# Patient Record
Sex: Female | Born: 1940 | Race: White | Hispanic: No | Marital: Married
Health system: Southern US, Community
[De-identification: ages and names within clinical notes are randomized; demographics above are authoritative.]

---

## 1999-04-25 ENCOUNTER — Encounter: Payer: Self-pay | Admitting: Family Medicine

## 1999-04-25 ENCOUNTER — Encounter: Admission: RE | Admit: 1999-04-25 | Discharge: 1999-04-25 | Payer: Self-pay | Admitting: Family Medicine

## 1999-05-03 ENCOUNTER — Encounter: Admission: RE | Admit: 1999-05-03 | Discharge: 1999-05-03 | Payer: Self-pay | Admitting: Family Medicine

## 1999-05-03 ENCOUNTER — Encounter: Payer: Self-pay | Admitting: Family Medicine

## 1999-11-01 ENCOUNTER — Encounter: Payer: Self-pay | Admitting: Family Medicine

## 1999-11-01 ENCOUNTER — Encounter: Admission: RE | Admit: 1999-11-01 | Discharge: 1999-11-01 | Payer: Self-pay | Admitting: Family Medicine

## 2000-12-06 ENCOUNTER — Encounter: Admission: RE | Admit: 2000-12-06 | Discharge: 2000-12-06 | Payer: Self-pay | Admitting: Family Medicine

## 2000-12-06 ENCOUNTER — Encounter: Payer: Self-pay | Admitting: Family Medicine

## 2002-01-02 ENCOUNTER — Encounter: Payer: Self-pay | Admitting: Family Medicine

## 2002-01-02 ENCOUNTER — Encounter: Admission: RE | Admit: 2002-01-02 | Discharge: 2002-01-02 | Payer: Self-pay | Admitting: Family Medicine

## 2003-01-21 ENCOUNTER — Encounter: Admission: RE | Admit: 2003-01-21 | Discharge: 2003-01-21 | Payer: Self-pay | Admitting: Gastroenterology

## 2003-11-15 ENCOUNTER — Ambulatory Visit (HOSPITAL_COMMUNITY): Admission: RE | Admit: 2003-11-15 | Discharge: 2003-11-15 | Payer: Self-pay | Admitting: Gastroenterology

## 2004-02-11 ENCOUNTER — Encounter: Admission: RE | Admit: 2004-02-11 | Discharge: 2004-02-11 | Payer: Self-pay | Admitting: Family Medicine

## 2004-09-20 ENCOUNTER — Encounter: Admission: RE | Admit: 2004-09-20 | Discharge: 2004-09-20 | Payer: Self-pay | Admitting: Family Medicine

## 2005-02-09 ENCOUNTER — Encounter: Admission: RE | Admit: 2005-02-09 | Discharge: 2005-02-09 | Payer: Self-pay | Admitting: Family Medicine

## 2005-02-16 ENCOUNTER — Encounter: Admission: RE | Admit: 2005-02-16 | Discharge: 2005-02-16 | Payer: Self-pay | Admitting: Family Medicine

## 2005-02-26 ENCOUNTER — Other Ambulatory Visit: Admission: RE | Admit: 2005-02-26 | Discharge: 2005-02-26 | Payer: Self-pay | Admitting: Interventional Radiology

## 2005-02-26 ENCOUNTER — Encounter: Admission: RE | Admit: 2005-02-26 | Discharge: 2005-02-26 | Payer: Self-pay | Admitting: Family Medicine

## 2005-03-15 ENCOUNTER — Encounter (INDEPENDENT_AMBULATORY_CARE_PROVIDER_SITE_OTHER): Payer: Self-pay | Admitting: Specialist

## 2005-03-15 ENCOUNTER — Encounter: Admission: RE | Admit: 2005-03-15 | Discharge: 2005-03-15 | Payer: Self-pay | Admitting: Family Medicine

## 2005-04-11 ENCOUNTER — Encounter: Admission: RE | Admit: 2005-04-11 | Discharge: 2005-04-11 | Payer: Self-pay | Admitting: Family Medicine

## 2005-05-02 ENCOUNTER — Encounter: Admission: RE | Admit: 2005-05-02 | Discharge: 2005-05-02 | Payer: Self-pay | Admitting: Family Medicine

## 2005-08-14 ENCOUNTER — Encounter: Admission: RE | Admit: 2005-08-14 | Discharge: 2005-08-14 | Payer: Self-pay | Admitting: Endocrinology

## 2006-02-11 ENCOUNTER — Encounter: Admission: RE | Admit: 2006-02-11 | Discharge: 2006-02-11 | Payer: Self-pay | Admitting: Endocrinology

## 2006-05-03 ENCOUNTER — Encounter: Admission: RE | Admit: 2006-05-03 | Discharge: 2006-05-03 | Payer: Self-pay | Admitting: Family Medicine

## 2007-02-10 ENCOUNTER — Encounter: Admission: RE | Admit: 2007-02-10 | Discharge: 2007-02-10 | Payer: Self-pay | Admitting: Endocrinology

## 2007-05-07 ENCOUNTER — Encounter: Admission: RE | Admit: 2007-05-07 | Discharge: 2007-05-07 | Payer: Self-pay | Admitting: Family Medicine

## 2007-09-07 IMAGING — CT CT CHEST W/O CM
1 series · 15 of 32 positions shown, 19 images · IV contrast (agent unspecified)
Comparison: none

CLINICAL DATA: Follow up of left lower lobe pulmonary nodule seen on abdominal CT scan dated 09/20/04.  
CT CHEST W/O CONTRAST:
CHEST CT WITHOUT CONTRAST:
TECHNIQUE: Multidetector CT imaging of the chest was performed following the standard protocol without IV contrast.
Scans were performed without contrast.  This scan demonstrates a slightly lobulated 6mm nodule in the left lower lobe as previously demonstrated appearing essentially unchanged in size and configuration.  There is a 5mm nodule more inferiorly and medially in the left lower lobe and a 4mm nodule more superiorly and medially in the left lower lobe. 
There are two tiny vague areas of density in the right lung without discrete definitive nodules.  There are some small nodes in the precarinal region as well as a possible 15mm node in the aortopulmonary window, although this could be partial volume from the top of the left main pulmonary artery.  
Remainder of the chest is within normal limits. 
The scan also demonstrates a 2cm lesion in the lateral aspect of the right lobe of the liver, unchanged in size and configuration.  There are multiple small punctate calcifications within the lesion, most of them on the periphery.  This is probably an atypical hemangioma.  It has not changed in size and configuration.

[Series 2: — · axial · 0.70mm/px · z∈[-299,+11]mm · 15 of 71 slices shown, 19 images]
[im 6/71  mediastinal]
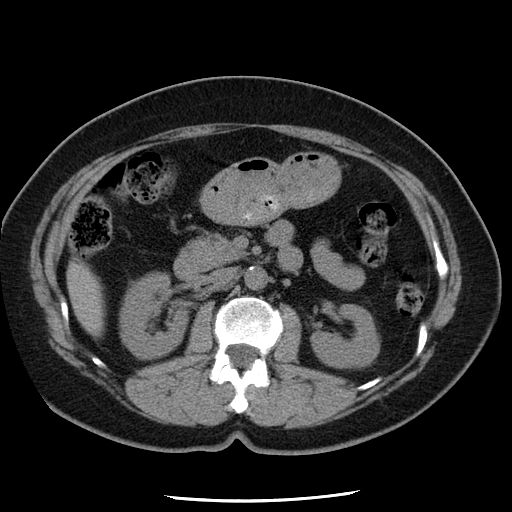
[im 6/71  lung]
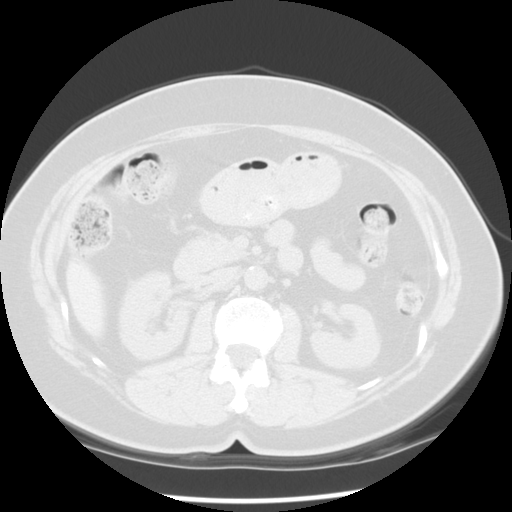
[im 11/71  lung]
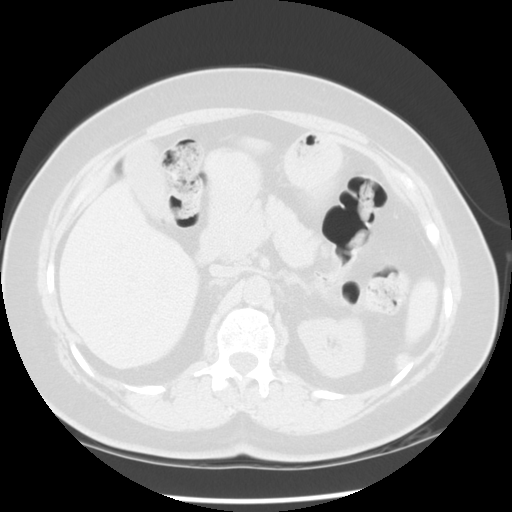
[im 15/71  lung]
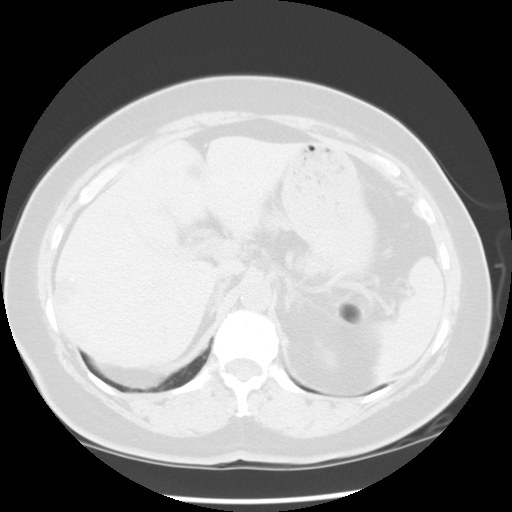
[im 19/71  lung]
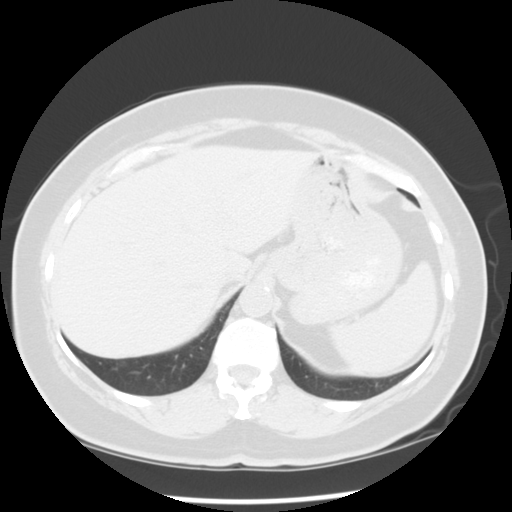
[im 24/71  mediastinal]
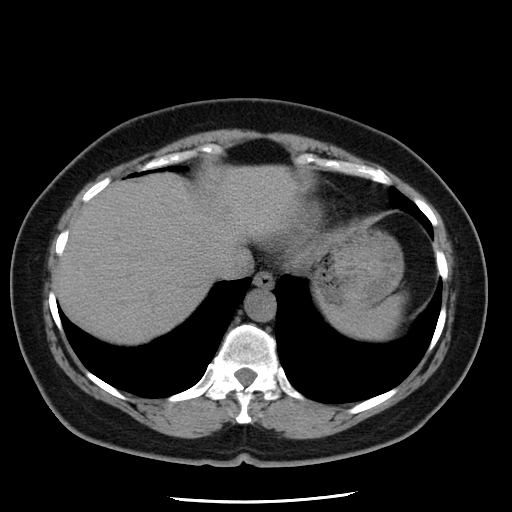
[im 24/71  lung]
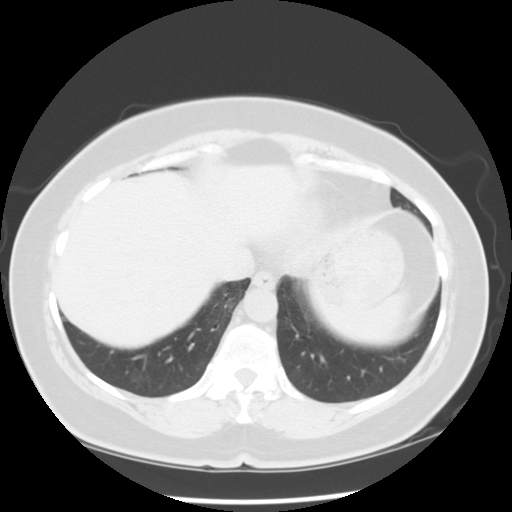
[im 29/71  lung]
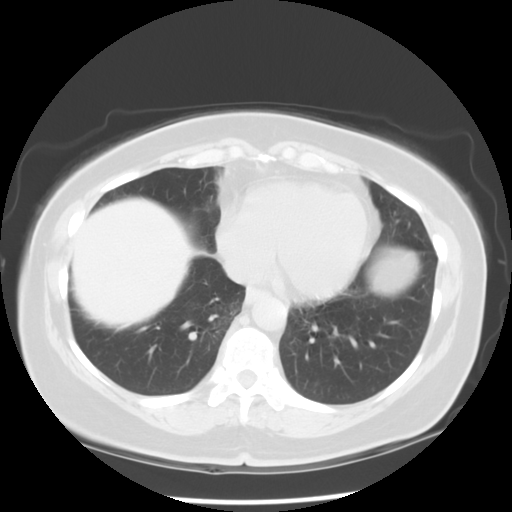
[im 34/71  lung]
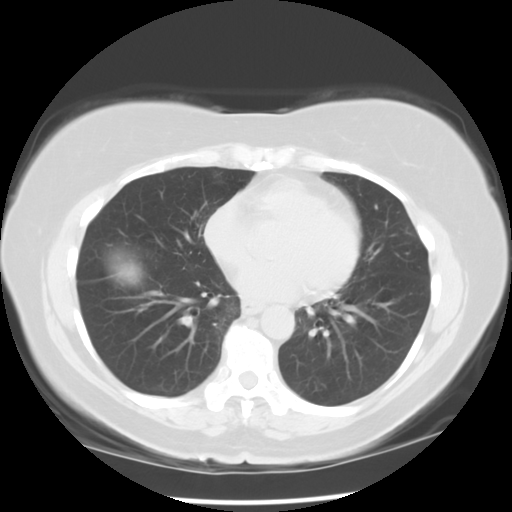
[im 38/71  lung]
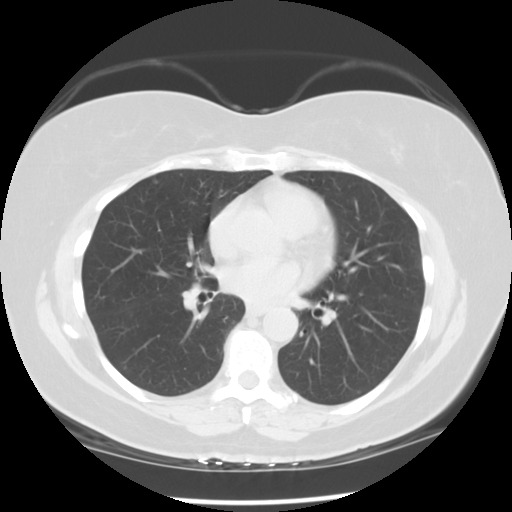
[im 42/71  mediastinal]
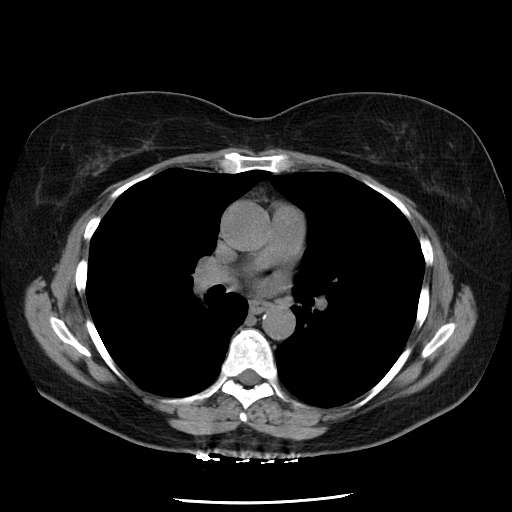
[im 42/71  lung]
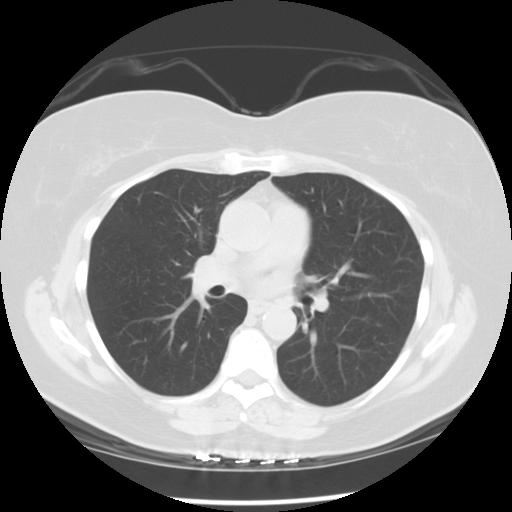
[im 45/71  lung]
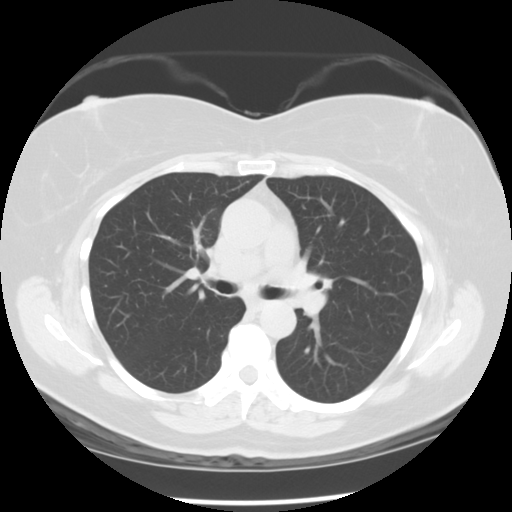
[im 50/71  lung]
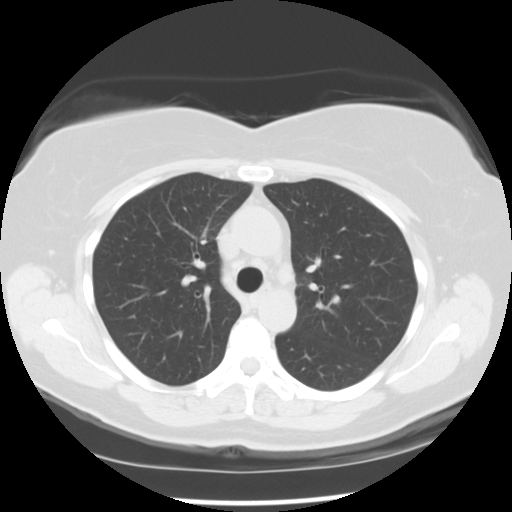
[im 55/71  lung]
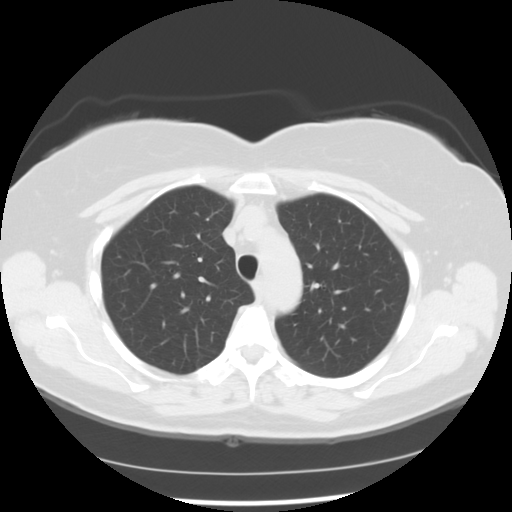
[im 58/71  mediastinal]
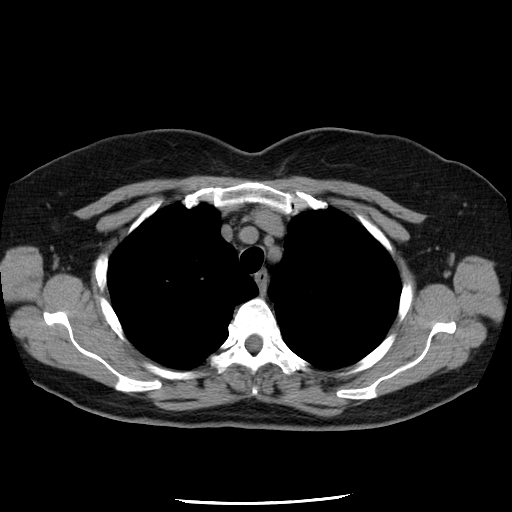
[im 58/71  lung]
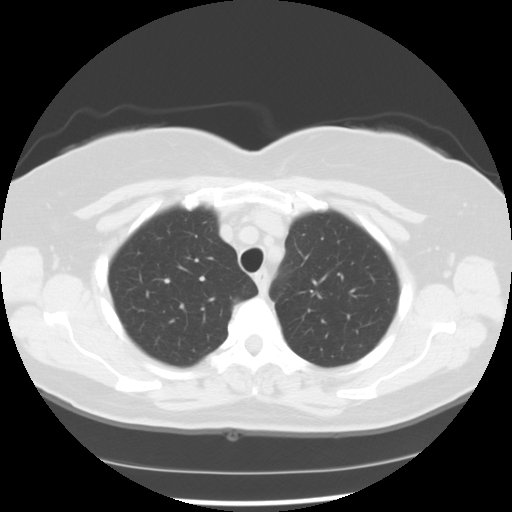
[im 63/71  lung]
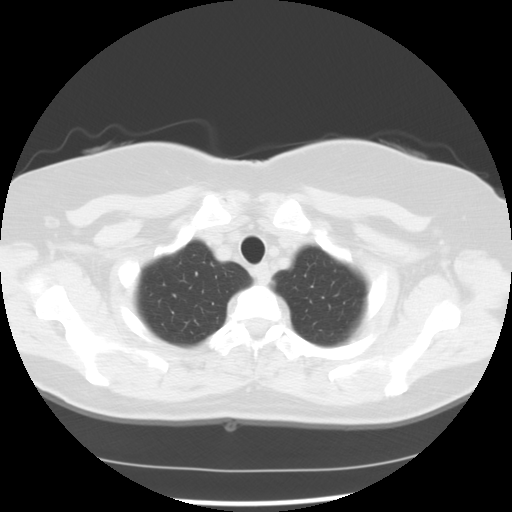
[im 68/71  lung]
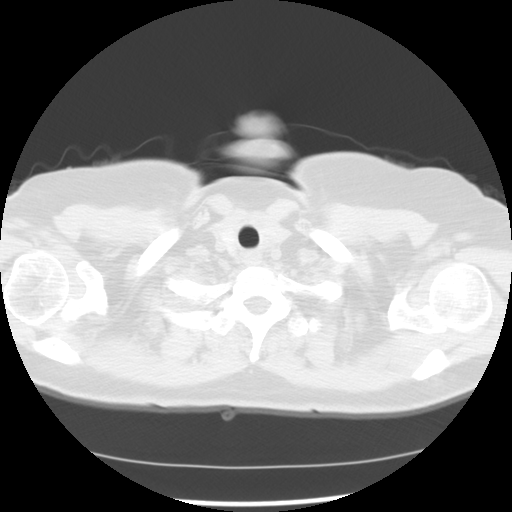

[15 of 32 positions shown; findings below may reference images not displayed]

IMPRESSION: The patient has multiple small nodules in the left lower lobe with the largest measuring 6mm in diameter and slightly lobulated.  These are most likely benign but given the size of the largest lesion, I do recommend an additional follow up in four months without contrast.  At this time the liver lesion could also be evaluated for ongoing stability since the calcifications are atypical for hemangioma.  However, this lesion has remained stable since [DATE].

## 2008-04-14 ENCOUNTER — Encounter: Admission: RE | Admit: 2008-04-14 | Discharge: 2008-04-14 | Payer: Self-pay | Admitting: Family Medicine

## 2008-05-11 ENCOUNTER — Encounter: Admission: RE | Admit: 2008-05-11 | Discharge: 2008-05-11 | Payer: Self-pay | Admitting: Family Medicine

## 2008-07-15 ENCOUNTER — Encounter: Admission: RE | Admit: 2008-07-15 | Discharge: 2008-07-15 | Payer: Self-pay | Admitting: Gastroenterology

## 2009-05-23 ENCOUNTER — Encounter: Admission: RE | Admit: 2009-05-23 | Discharge: 2009-05-23 | Payer: Self-pay | Admitting: Family Medicine

## 2009-09-07 IMAGING — US US SOFT TISSUE HEAD/NECK
1 series · 13 of 25 positions shown · non-contrast
Comparison: 02/11/2006

THYROID ULTRASOUND:

CLINICAL DATA: Thyroid nodule
TECHNIQUE: Ultrasound examination of the thyroid gland and adjacent soft tissue
structures was performed.

[Series 1: us soft tissue head/neck · 0.08mm/px · 13 of 33 slices shown]
[im 1/33]
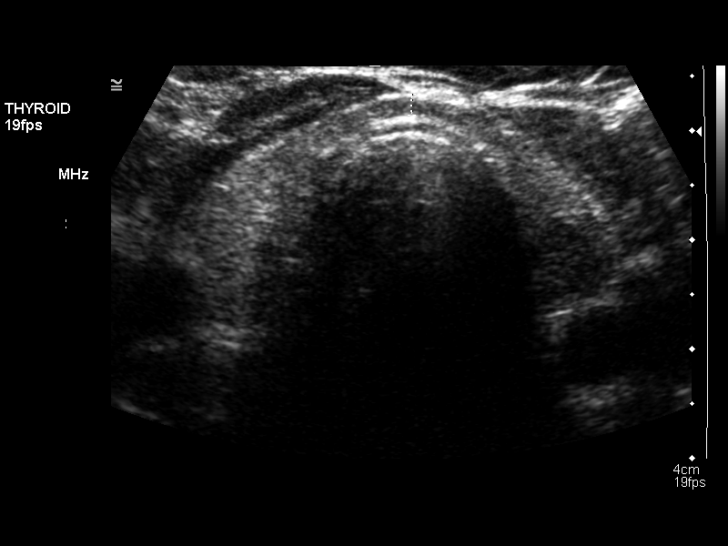
[im 3/33]
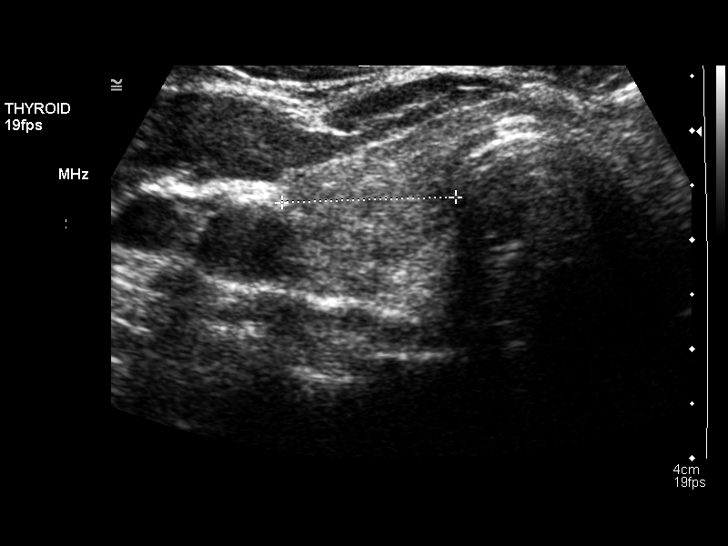
[im 6/33]
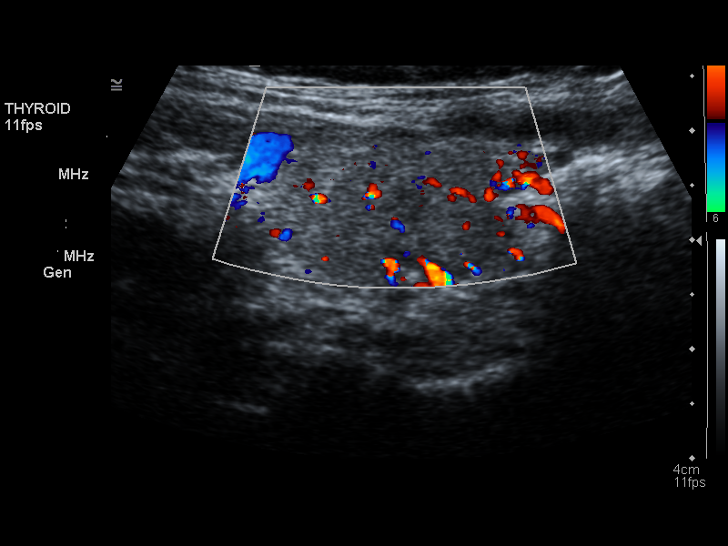
[im 9/33]
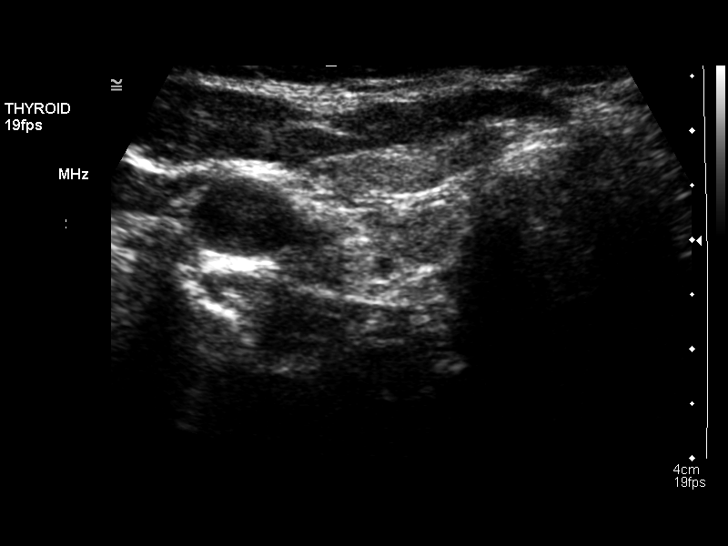
[im 11/33]
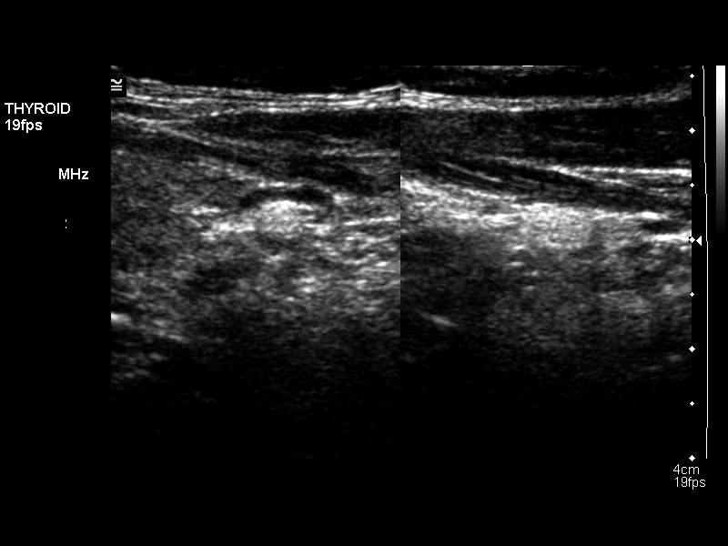
[im 14/33]
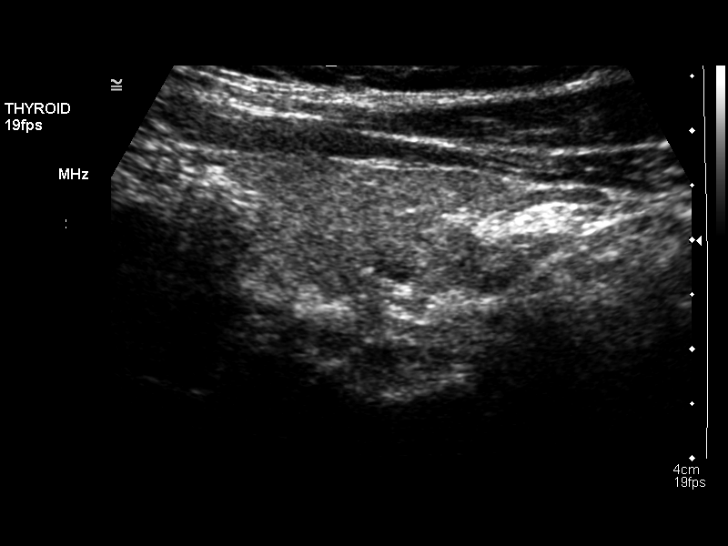
[im 17/33]
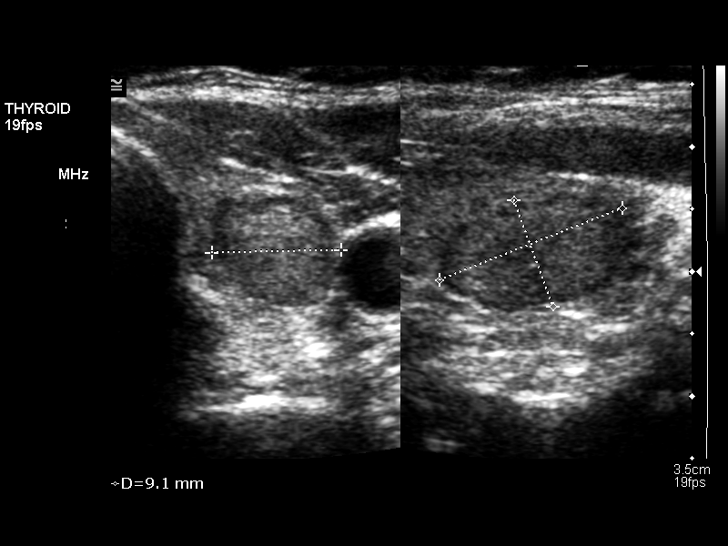
[im 19/33]
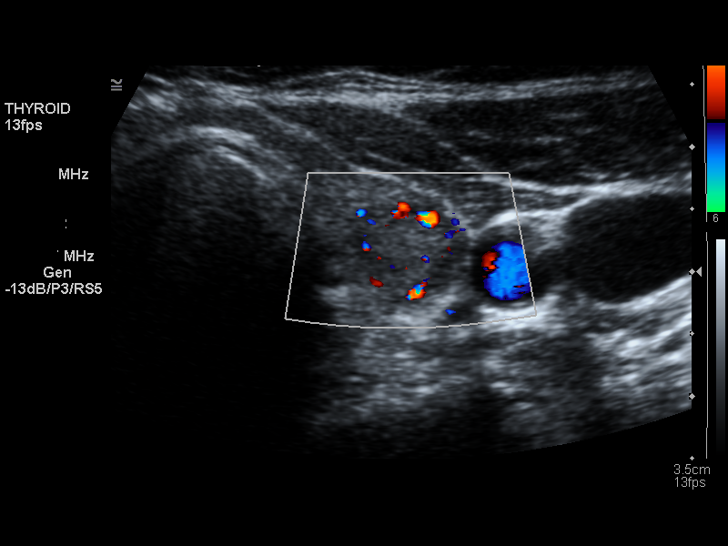
[im 22/33]
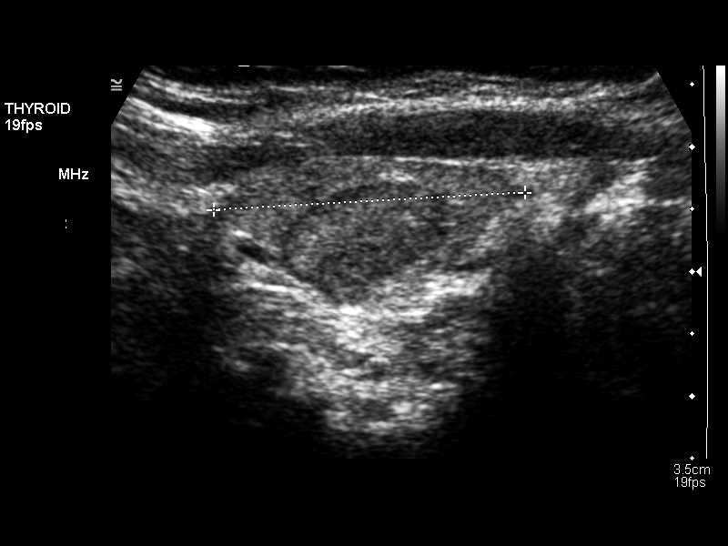
[im 25/33]
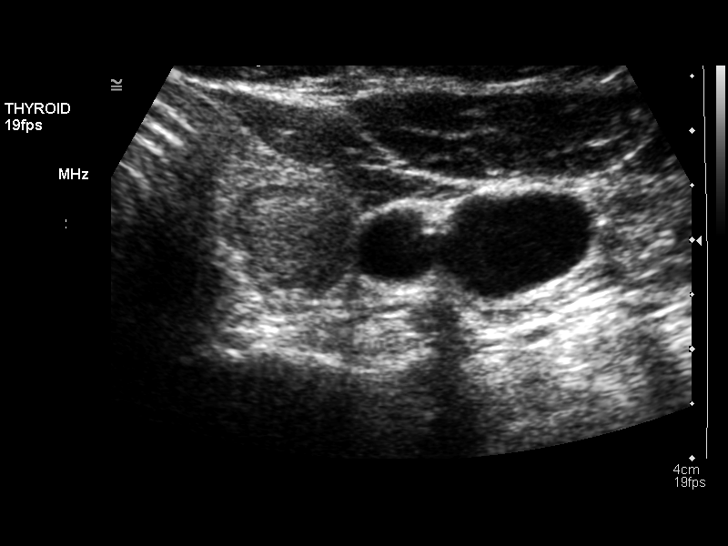
[im 27/33]
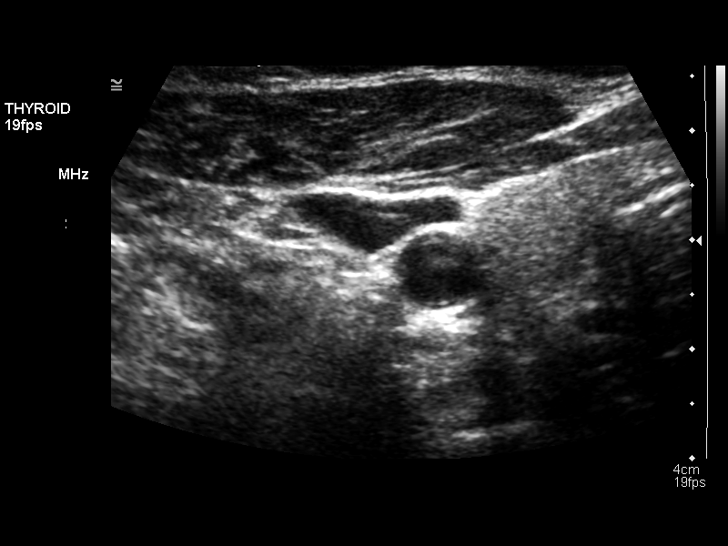
[im 30/33]
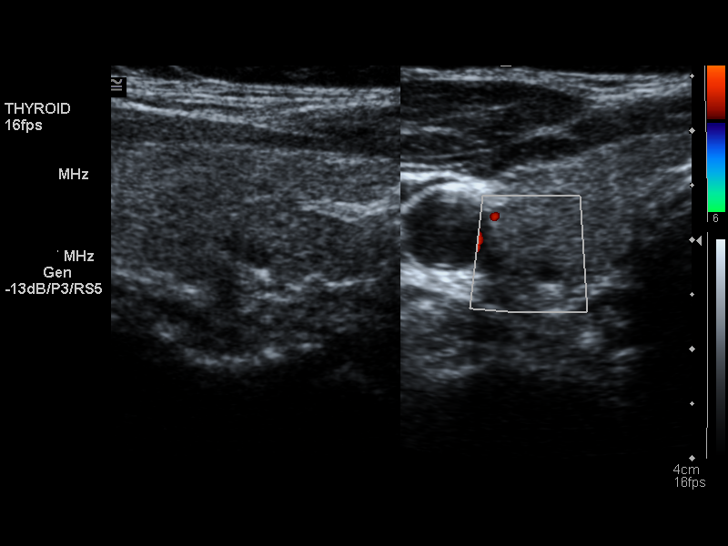
[im 33/33]
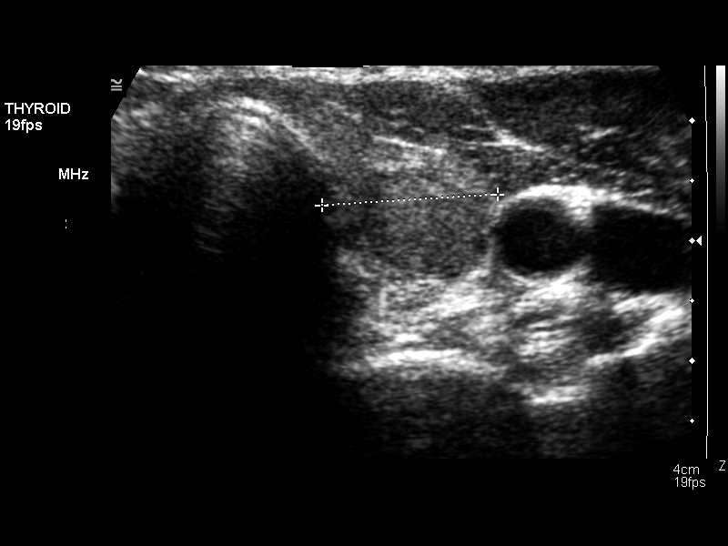

[13 of 25 positions shown; findings below may reference images not displayed]

FINDINGS: The right thyroid lobe measures 3.3 x 1.3 x 1.6 cm.  The left lobe
measures 2.5 x 1.2 x 1.5 cm.  The isthmus measures 1-2 mm in thickness.  Thyroid
echotexture is mildly heterogeneous. Several discrete nodules are identified.
The dominant nodule is in the left thyroid lobe and measures 1.0 x 1.6 x 0.9 cm.
This is unchanged in the interval. The nodule seen in the interpolar region of
the right gland measures 5 x 3 x 3 mm today compared 8 x 4 x 6 mm previously. A
6 x 7 mm nodule was measured at the lower pole of the right thyroid gland on
today's study., but this is not definitely within the thyroid parenchyma.  This
could be a small lymph node or parathyroid gland.
IMPRESSION: No change in the dominant left thyroid nodule. A small nodule in the mid aspect
of the right thyroid gland is stable.

The very lateral tiny nodule on the left seen previously was not visible today.
On today's exam, a nodule measured at the lower pole of the right thyroid gland
may not actually be within the thyroid parenchyma, but may be just posterior to
the thyroid tissue.

## 2010-04-09 ENCOUNTER — Encounter: Payer: Self-pay | Admitting: Family Medicine

## 2010-05-01 ENCOUNTER — Other Ambulatory Visit: Payer: Self-pay | Admitting: Family Medicine

## 2010-05-01 DIAGNOSIS — Z1231 Encounter for screening mammogram for malignant neoplasm of breast: Secondary | ICD-10-CM

## 2010-05-30 ENCOUNTER — Ambulatory Visit
Admission: RE | Admit: 2010-05-30 | Discharge: 2010-05-30 | Disposition: A | Discharge status: Home / Self Care | Payer: MEDICARE | Source: Ambulatory Visit | Attending: Family Medicine | Admitting: Family Medicine

## 2010-05-30 DIAGNOSIS — Z1231 Encounter for screening mammogram for malignant neoplasm of breast: Secondary | ICD-10-CM

## 2010-08-04 NOTE — Op Note (Signed)
NAME:  Angelica Owens, Angelica Owens                             ACCOUNT NO.:  [id number]   MEDICAL RECORD NO.:  [id number]                   PATIENT TYPE:  AMB   LOCATION:  ENDO                                 FACILITY:  [facility]   PHYSICIAN:  Graylin Shiver, M.D.                DATE OF BIRTH:  [birth year]   DATE OF PROCEDURE:  11/15/2003  DATE OF DISCHARGE:                                 OPERATIVE REPORT   PROCEDURE:  Colonoscopy.   INDICATIONS FOR PROCEDURE:  Rectal bleeding.   Informed consent was obtained after explanation of the risks of bleeding,  infection and perforation.   PREMEDICATION:  Fentanyl 125 mcg IV, Versed 10 mg IV.   DESCRIPTION OF PROCEDURE:  With the patient in the left lateral decubitus  position, a rectal exam was performed and no masses were felt. The Olympus  colonoscope was then inserted into the rectum and advanced around the colon  to the cecum. Cecal landmarks were identified. The cecum and ascending colon  were normal. The transverse colon was normal.  The descending colon and  sigmoid revealed diverticulosis. The rectum was normal. Some small  hemorrhoids were seen as the scope was brought out. She tolerated the  procedure well without complications.   IMPRESSION:  1.  Diverticulosis.  2.  Small hemorrhoids.                                               Graylin Shiver, M.D.    Germain Osgood  D:  11/15/2003  T:  11/16/2003  Job:  [id number]   cc:   Joycelyn Rua, M.D.  [address] [place]  [place] [address]  Fax: [phone]

## 2011-04-24 ENCOUNTER — Other Ambulatory Visit: Payer: Self-pay | Admitting: Family Medicine

## 2011-04-24 DIAGNOSIS — Z0000 Encounter for general adult medical examination without abnormal findings: Secondary | ICD-10-CM

## 2011-06-05 ENCOUNTER — Ambulatory Visit: Payer: Medicare Other

## 2011-06-05 ENCOUNTER — Ambulatory Visit
Admission: RE | Admit: 2011-06-05 | Discharge: 2011-06-05 | Disposition: A | Discharge status: Home / Self Care | Payer: Medicare Other | Source: Ambulatory Visit | Attending: Family Medicine | Admitting: Family Medicine

## 2011-06-05 DIAGNOSIS — Z0000 Encounter for general adult medical examination without abnormal findings: Secondary | ICD-10-CM

## 2012-06-27 ENCOUNTER — Other Ambulatory Visit: Payer: Self-pay | Admitting: Orthopedic Surgery

## 2012-06-27 DIAGNOSIS — M545 Low back pain, unspecified: Secondary | ICD-10-CM

## 2012-07-04 ENCOUNTER — Ambulatory Visit
Admission: RE | Admit: 2012-07-04 | Discharge: 2012-07-04 | Disposition: A | Discharge status: Home / Self Care | Payer: Medicare Other | Source: Ambulatory Visit | Attending: Orthopedic Surgery | Admitting: Orthopedic Surgery

## 2012-07-04 DIAGNOSIS — M545 Low back pain, unspecified: Secondary | ICD-10-CM

## 2020-02-17 ENCOUNTER — Ambulatory Visit (INDEPENDENT_AMBULATORY_CARE_PROVIDER_SITE_OTHER): Payer: Medicare HMO | Admitting: Physical Therapy

## 2020-02-17 ENCOUNTER — Encounter: Payer: Self-pay | Admitting: Physical Therapy

## 2020-02-17 ENCOUNTER — Other Ambulatory Visit: Payer: Self-pay

## 2020-02-17 DIAGNOSIS — R2681 Unsteadiness on feet: Secondary | ICD-10-CM | POA: Diagnosis not present

## 2020-02-17 DIAGNOSIS — M25562 Pain in left knee: Secondary | ICD-10-CM | POA: Diagnosis not present

## 2020-02-17 DIAGNOSIS — M25561 Pain in right knee: Secondary | ICD-10-CM | POA: Diagnosis not present

## 2020-02-17 DIAGNOSIS — G8929 Other chronic pain: Secondary | ICD-10-CM

## 2020-02-17 DIAGNOSIS — M6281 Muscle weakness (generalized): Secondary | ICD-10-CM

## 2020-02-17 NOTE — Therapy (Signed)
[facility] [place] [address] [place], [place], [address] Phone: [phone]   Fax:  [phone]  Physical Therapy Evaluation  Patient Details  Name: Angelica Owens MRN: [id number] Date of Birth: 07-24-1940 Referring Provider (PT): Pricilla Holm    Encounter Date: 02/17/2020   PT End of Session - 02/17/20 1635    Visit Number 1    Number of Visits 12    Date for PT Re-Evaluation 03/30/20    Authorization Type [employer]    Authorization Time Period 02/17/2020 to 03/30/2020    Authorization - Visit Number 1    Authorization - Number of Visits 12   Progress Note Due on Visit 10    PT Start Time 1517    PT Stop Time 1600    PT Time Calculation (min) 43 min    Activity Tolerance Patient tolerated treatment well    Behavior During Therapy [facility] for tasks assessed/performed           History reviewed. No pertinent past medical history.  History reviewed. No pertinent surgical history.  There were no vitals filed for this visit.    Subjective Assessment - 02/17/20 1520    Subjective I've never had any issues with my knees until the summer when I was coming in and out of the camper and my right knee wanted to keep grabbing me. I almost fall every time it grabs. A shot from my rheumotologist helped for awhile, then it began hurting again. Her doctor told her she is bascially bone to bone, but wanted her to try strengthening first.    Patient Stated Goals less pain/no falls    Currently in Pain? No/denies    Multiple Pain Sites No              [facility] PT Assessment - 02/17/20 0001      Assessment   Medical Diagnosis knee pain     Referring Provider (PT) Cordelia Pen Ryter-Brown     Onset Date/Surgical Date --   spring 2021    Next MD Visit referring MD in january     Prior Therapy none       Precautions   Precautions None      Restrictions   Weight Bearing Restrictions No      Balance Screen   Has the patient  fallen in the past 6 months Yes    How many times? 1- tripped in garden; has a lot of close calls/stumbles     Has the patient had a decrease in activity level because of a fear of falling?  Yes    Is the patient reluctant to leave their home because of a fear of falling?  No      Home Tourist information centre manager residence      Prior Function   Level of Independence Independent    Leisure reading, working crossword puzzles and jigsaw puzzles       Observation/Other Assessments   Focus on Therapeutic Outcomes (FOTO)  51.9      AROM   Right Knee Extension 5    Right Knee Flexion 129    Left Knee Extension 3    Left Knee Flexion 130      Strength   Right Hip Flexion 4+/5    Right Hip Extension 3/5    Right Hip ABduction 4+/5    Left Hip Flexion 4+/5    Left Hip Extension 3/5    Left Hip ABduction 4+/5  Right Knee Flexion 4+/5    Right Knee Extension 5/5    Left Knee Flexion 4+/5    Left Knee Extension 5/5    Right Ankle Dorsiflexion 5/5    Left Ankle Dorsiflexion 5/5      Flexibility   Hamstrings moderate limitation R>L    Quadriceps severe limitation R>L     Piriformis severe limitation R>L       Dynamic Gait Index   Level Surface Normal    Change in Gait Speed Mild Impairment    Gait with Horizontal Head Turns Moderate Impairment    Gait with Vertical Head Turns Moderate Impairment    Gait and Pivot Turn Mild Impairment    Step Over Obstacle Moderate Impairment    Step Around Obstacles Mild Impairment    Steps Mild Impairment    Total Score 14                      Objective measurements completed on examination: See above findings.               PT Education - 02/17/20 1635    Education Details exam findings, prognosis and HEP, PT expectations    Person(s) Educated Patient    Methods Explanation;Demonstration;Handout    Comprehension Verbalized understanding;Returned demonstration            PT Short Term Goals -  02/17/20 1639      PT SHORT TERM GOAL #1   Title Patient will be independent with appropriate HEP    Time 3    Period Weeks    Status New    Target Date 03/09/20      PT SHORT TERM GOAL #2   Title Patient will show improved flexibility in BLE musculature by 50% in order to reduce pain and allow for more advanced strengthening    Time 3    Period Weeks      PT SHORT TERM GOAL #3   Title Patient will report knee pain as being no more than 4/10 at worst    Time 3    Period Weeks    Status New      PT SHORT TERM GOAL #4   Title Patient will be compliant with regular walking routine at least 10-20 minutes daily    Time 3    Period Weeks    Status New             PT Long Term Goals - 02/17/20 1641      PT LONG TERM GOAL #1   Title Patient will show MMT at least 4+/5 though an increased range of motion in order to show functional strength gain and support knees through greater ROM    Time 6    Period Weeks    Status New    Target Date 03/30/20      PT LONG TERM GOAL #2   Title Patient to score at least 19/24 on DGI to show reduced fall risk    Time 6    Period Weeks    Status New      PT LONG TERM GOAL #3   Title Patient will be able to perform squat with correct form without cuing in order to show good mechanics and protect knees from excessive wear and tear    Time 6    Period Weeks    Status New      PT LONG TERM GOAL #4   Title Patient will be  compliant with regular exercise program which can be directly translated to local gym in order to facilitate ongoing fitness routine after DC    Time 6    Period Weeks    Status New                  Plan - 02/17/20 1636    Clinical Impression Statement Ms. Stillman arrives with significant concerns of ongoing knee pain related to close calls with falling; she reports her MD has told her she is bone on bone and would benefit from PT to improve knee health and reduce fall risk. Exam reveals significant knee and hip  muscle stiffness; she is strong within  a certain range but certainly experiences reduced functional strength and endurance outside of that ROM. She also demonstrates considerable balance impairment, only scoring 14/24 on DGI. Will benefit from skilled PT services to address identified impairments and reduce fall risk moving forward   Personal Factors and Comorbidities Age;Fitness;Time since onset of injury/illness/exacerbation;Comorbidity 1    Comorbidities fibromyalgia    Examination-Activity Limitations Locomotion Level;Transfers;Bend;Squat;Stairs;Stand;Lift    Examination-Participation Restrictions Cleaning;Community Activity;Yard Work    Stability/Clinical Decision Making Stable/Uncomplicated    Optometrist Low    Rehab Potential Excellent    PT Frequency 2x / week    PT Duration 6 weeks    PT Treatment/Interventions ADLs/Self Care Home Management;Aquatic Therapy;Cryotherapy;Electrical Stimulation;Iontophoresis 4mg /ml Dexamethasone;Moist Heat;Ultrasound;DME Instruction;Gait training;Stair training;Functional mobility training;Therapeutic activities;Therapeutic exercise;Balance training;Neuromuscular re-education;Patient/family education;Orthotic Fit/Training;Manual techniques;Passive range of motion;Dry needling;Energy conservation;Taping;Joint Manipulations    PT Next Visit Plan review HEP; work on overall flexibility and strenth in increased range of motion. Machine strengthening. Balance training    PT Home Exercise Plan [id number]    Consulted and Agree with Plan of Care Patient           Patient will benefit from skilled therapeutic intervention in order to improve the following deficits and impairments:  Difficulty walking, Increased fascial restricitons, Decreased endurance, Increased muscle spasms, Decreased activity tolerance, Pain, Decreased balance, Improper body mechanics, Decreased mobility, Decreased strength, Postural dysfunction  Visit Diagnosis: Chronic pain of  right knee  Chronic pain of left knee  Muscle weakness (generalized)  Unsteadiness on feet     Problem List There are no problems to display for this patient.   , DPT, PN1   Supplemental Physical Therapist [facility]    Pager [phone] Acute Rehab Office [phone]   [facility] [place] [address] [place], [place], [place] Phone: [phone]   Fax:  [phone]  Name: Angelica Owens MRN: Marian Sorrow Date of Birth: 03-04-1941

## 2020-02-24 ENCOUNTER — Other Ambulatory Visit: Payer: Self-pay

## 2020-02-24 ENCOUNTER — Ambulatory Visit: Payer: Medicare HMO | Admitting: Physical Therapy

## 2020-02-24 ENCOUNTER — Encounter: Payer: Self-pay | Admitting: Physical Therapy

## 2020-02-24 DIAGNOSIS — R2681 Unsteadiness on feet: Secondary | ICD-10-CM | POA: Diagnosis not present

## 2020-02-24 DIAGNOSIS — M6281 Muscle weakness (generalized): Secondary | ICD-10-CM

## 2020-02-24 DIAGNOSIS — M25562 Pain in left knee: Secondary | ICD-10-CM | POA: Diagnosis not present

## 2020-02-24 DIAGNOSIS — G8929 Other chronic pain: Secondary | ICD-10-CM

## 2020-02-24 DIAGNOSIS — M25561 Pain in right knee: Secondary | ICD-10-CM

## 2020-02-24 NOTE — Therapy (Signed)
[facility] [place] [address] [place], [place], [address] Phone: [phone]   Fax:  [phone]  Physical Therapy Treatment  Patient Details  Name: Angelica Owens MRN: [id number] Date of Birth: [birth year] Referring Provider (PT): Pricilla Holm    Encounter Date: 02/24/2020   PT End of Session - 02/24/20 1141    PT Start Time 1100    PT Stop Time 1143    PT Time Calculation (min) 43 min    Activity Tolerance Patient tolerated treatment well    Behavior During Therapy [facility] for tasks assessed/performed           History reviewed. No pertinent past medical history.  History reviewed. No pertinent surgical history.  There were no vitals filed for this visit.   Subjective Assessment - 02/24/20 1100    Subjective My knee feels ok. I have been trying to do the exercises and they are going well    Patient Stated Goals less pain/no falls    Currently in Pain? Yes    Pain Score 3     Pain Location Knee    Pain Orientation Right    Pain Descriptors / Indicators Aching    Pain Type Chronic pain    Pain Onset More than a month ago    Pain Frequency Occasional                             [facility] Adult PT Treatment/Exercise - 02/24/20 0001      High Level Balance   High Level Balance Comments standing tandem at counter with goal of up to 30 seconds each side      Knee/Hip Exercises: Stretches   Passive Hamstring Stretch Both;2 reps;60 seconds    Gastroc Stretch Both;2 reps;30 seconds   standing     Knee/Hip Exercises: Aerobic   Nustep 6 minutes level 4      Knee/Hip Exercises: Standing   Heel Raises Both;2 sets;10 reps    Knee Flexion Strengthening;Both;2 sets;10 reps    Hip Abduction Stengthening;Both;2 sets;10 reps;Knee straight   2#   Other Standing Knee Exercises mini squats at counter 2 x 10      Knee/Hip Exercises: Seated   Long Arc Quad Strengthening;Both;10 reps;Weights    Long Arc Quad  Weight 2 lbs.      Knee/Hip Exercises: Supine   Bridges Strengthening;Both;2 sets;10 reps                  PT Education - 02/24/20 1121    Education Details updated HEP - will update in medbridge next session    Person(s) Educated Patient    Methods Explanation;Demonstration    Comprehension Verbalized understanding;Returned demonstration            PT Short Term Goals - 02/17/20 1639      PT SHORT TERM GOAL #1   Title Patient will be independent with appropriate HEP    Time 3    Period Weeks    Status New    Target Date 03/09/20      PT SHORT TERM GOAL #2   Title Patient will show improved flexibility in BLE musculature by 50% in order to reduce pain and allow for more advanced strengthening    Time 3    Period Weeks      PT SHORT TERM GOAL #3   Title Patient will report knee pain as being no more than 4/10 at  worst    Time 3    Period Weeks    Status New      PT SHORT TERM GOAL #4   Title Patient will be compliant with regular walking routine at least 10-20 minutes daily    Time 3    Period Weeks    Status New             PT Long Term Goals - 02/17/20 1641      PT LONG TERM GOAL #1   Title Patient will show MMT at least 4+/5 though an increased range of motion in order to show functional strength gain and support knees through greater ROM    Time 6    Period Weeks    Status New    Target Date 03/30/20      PT LONG TERM GOAL #2   Title Patient to score at least 19/24 on DGI to show reduced fall risk    Time 6    Period Weeks    Status New      PT LONG TERM GOAL #3   Title Patient will be able to perform squat with correct form without cuing in order to show good mechanics and protect knees from excessive wear and tear    Time 6    Period Weeks    Status New      PT LONG TERM GOAL #4   Title Patient will be compliant with regular exercise program which can be directly translated to local gym in order to facilitate ongoing fitness routine  after DC    Time 6    Period Weeks    Status New                 Plan - 02/24/20 1139    Clinical Impression Statement pt with good tolerance to addition of exercises. requires frequent seated rests due to fatigue    Personal Factors and Comorbidities Age;Fitness;Time since onset of injury/illness/exacerbation;Comorbidity 1    Comorbidities fibromyalgia    Stability/Clinical Decision Making Stable/Uncomplicated    Rehab Potential Excellent    PT Frequency 2x / week    PT Duration 6 weeks    PT Treatment/Interventions ADLs/Self Care Home Management;Aquatic Therapy;Cryotherapy;Electrical Stimulation;Iontophoresis 4mg /ml Dexamethasone;Moist Heat;Ultrasound;DME Instruction;Gait training;Stair training;Functional mobility training;Therapeutic activities;Therapeutic exercise;Balance training;Neuromuscular re-education;Patient/family education;Orthotic Fit/Training;Manual techniques;Passive range of motion;Dry needling;Energy conservation;Taping;Joint Manipulations    PT Next Visit Plan assess change to HEP, increase cardiovascular endurance and balance    PT Home Exercise Plan [id number]           Patient will benefit from skilled therapeutic intervention in order to improve the following deficits and impairments:  Difficulty walking, Increased fascial restricitons, Decreased endurance, Increased muscle spasms, Decreased activity tolerance, Pain, Decreased balance, Improper body mechanics, Decreased mobility, Decreased strength, Postural dysfunction  Visit Diagnosis: Chronic pain of right knee  Chronic pain of left knee  Muscle weakness (generalized)  Unsteadiness on feet     Problem List There are no problems to display for this patient.  , PT  [facility] 02/24/2020, 11:42 AM  [place] [address] [place], [place], [place] Phone: [phone]   Fax:  [phone]  Name: Angelica Owens MRN: Marian Sorrow Date of Birth: 1940/09/21

## 2020-02-26 ENCOUNTER — Ambulatory Visit: Payer: Medicare HMO | Admitting: Physical Therapy

## 2020-02-26 ENCOUNTER — Other Ambulatory Visit: Payer: Self-pay

## 2020-02-26 ENCOUNTER — Encounter: Payer: Self-pay | Admitting: Physical Therapy

## 2020-02-26 DIAGNOSIS — R2681 Unsteadiness on feet: Secondary | ICD-10-CM | POA: Diagnosis not present

## 2020-02-26 DIAGNOSIS — M25561 Pain in right knee: Secondary | ICD-10-CM

## 2020-02-26 DIAGNOSIS — M6281 Muscle weakness (generalized): Secondary | ICD-10-CM | POA: Diagnosis not present

## 2020-02-26 DIAGNOSIS — M25562 Pain in left knee: Secondary | ICD-10-CM | POA: Diagnosis not present

## 2020-02-26 DIAGNOSIS — G8929 Other chronic pain: Secondary | ICD-10-CM

## 2020-02-26 NOTE — Therapy (Signed)
[facility] [place] [address] [place], [place], [address] Phone: [phone]   Fax:  [phone]  Physical Therapy Treatment  Patient Details  Name: Angelica Owens MRN: [id number] Date of Birth: Aug 18, 1940 Referring Provider (PT): Pricilla Holm    Encounter Date: 02/26/2020   PT End of Session - 02/26/20 1025    Visit Number 3    Number of Visits 12    Authorization Type [employer]    Authorization Time Period 02/17/2020 to 03/30/2020    Authorization - Visit Number 3    Authorization - Number of Visits 10    Progress Note Due on Visit 10    PT Start Time 0849    PT Stop Time 0928    PT Time Calculation (min) 39 min    Activity Tolerance Patient tolerated treatment well    Behavior During Therapy [facility] for tasks assessed/performed           History reviewed. No pertinent past medical history.  History reviewed. No pertinent surgical history.  There were no vitals filed for this visit.   Subjective Assessment - 02/26/20 0851    Subjective My arm is very sore after I got the covid booster. The knees are feeling good, I was tired after the last session. My calves are sore.    Patient Stated Goals less pain/no falls    Currently in Pain? Yes    Pain Score 4     Pain Location Knee    Pain Orientation Right    Pain Descriptors / Indicators Aching;Tightness    Pain Type Chronic pain    Pain Onset More than a month ago    Multiple Pain Sites No                             [facility] Adult PT Treatment/Exercise - 02/26/20 0001      Knee/Hip Exercises: Stretches   Gastroc Stretch 3 reps;30 seconds      Knee/Hip Exercises: Standing   Forward Lunges Both;2 sets;10 reps    Forward Lunges Limitations 6 inch step    Forward Step Up Both;2 sets;10 reps    Forward Step Up Limitations 4 inch step    Step Down Both;10 reps;2 sets    Step Down Limitations 4 inch step    Other Standing Knee Exercises  mini squats 1x10      Knee/Hip Exercises: Supine   Short Arc Quad Sets Strengthening;Both;1 set;10 reps    Short Arc Quad Sets Limitations 5#    Bridges Strengthening;Both;1 set;15 reps                  PT Education - 02/26/20 1025    Education Details exercise form, benefits; education on DOMS and management strategies    Person(s) Educated Patient    Methods Explanation;Demonstration    Comprehension Verbalized understanding            PT Short Term Goals - 02/17/20 1639      PT SHORT TERM GOAL #1   Title Patient will be independent with appropriate HEP    Time 3    Period Weeks    Status New    Target Date 03/09/20      PT SHORT TERM GOAL #2   Title Patient will show improved flexibility in BLE musculature by 50% in order to reduce pain and allow for more advanced strengthening    Time 3  Period Weeks      PT SHORT TERM GOAL #3   Title Patient will report knee pain as being no more than 4/10 at worst    Time 3    Period Weeks    Status New      PT SHORT TERM GOAL #4   Title Patient will be compliant with regular walking routine at least 10-20 minutes daily    Time 3    Period Weeks    Status New             PT Long Term Goals - 02/17/20 1641      PT LONG TERM GOAL #1   Title Patient will show MMT at least 4+/5 though an increased range of motion in order to show functional strength gain and support knees through greater ROM    Time 6    Period Weeks    Status New    Target Date 03/30/20      PT LONG TERM GOAL #2   Title Patient to score at least 19/24 on DGI to show reduced fall risk    Time 6    Period Weeks    Status New      PT LONG TERM GOAL #3   Title Patient will be able to perform squat with correct form without cuing in order to show good mechanics and protect knees from excessive wear and tear    Time 6    Period Weeks    Status New      PT LONG TERM GOAL #4   Title Patient will be compliant with regular exercise program  which can be directly translated to local gym in order to facilitate ongoing fitness routine after DC    Time 6    Period Weeks    Status New                 Plan - 02/26/20 1026    Clinical Impression Statement Marcha arrives today reporting she felt well after last therapy session, main complaint is still knee pain and LE stiffness. Continued focus on stretching and functional strengthening in open and closed chain positions with good tolerance noted. Focus on body weight super sets today for enhanced strengthening and endurance with rest breaks as appropriate. Did need mod cues for form and technique but had no increased pain with all exercises. Fatigued at EOS. Encouraged good compliance with HEP.    Personal Factors and Comorbidities Age;Fitness;Time since onset of injury/illness/exacerbation;Comorbidity 1    Comorbidities fibromyalgia    Examination-Activity Limitations Locomotion Level;Transfers;Bend;Squat;Stairs;Stand;Lift    Stability/Clinical Decision Making Stable/Uncomplicated    Clinical Decision Making Low    Rehab Potential Excellent    PT Frequency 2x / week    PT Duration 6 weeks    PT Treatment/Interventions ADLs/Self Care Home Management;Aquatic Therapy;Cryotherapy;Electrical Stimulation;Iontophoresis 4mg /ml Dexamethasone;Moist Heat;Ultrasound;DME Instruction;Gait training;Stair training;Functional mobility training;Therapeutic activities;Therapeutic exercise;Balance training;Neuromuscular re-education;Patient/family education;Orthotic Fit/Training;Manual techniques;Passive range of motion;Dry needling;Energy conservation;Taping;Joint Manipulations    PT Next Visit Plan assess change to HEP, increase cardiovascular endurance and balance. Continue closed chain strength.    PT Home Exercise Plan [email or web address]    Consulted and Agree with Plan of Care Patient           Patient will benefit from skilled therapeutic intervention in order to improve the following deficits and  impairments:  Difficulty walking,Increased fascial restricitons,Decreased endurance,Increased muscle spasms,Decreased activity tolerance,Pain,Decreased balance,Improper body mechanics,Decreased mobility,Decreased strength,Postural dysfunction  Visit Diagnosis: Chronic pain of right knee  Chronic  pain of left knee  Muscle weakness (generalized)  Unsteadiness on feet     Problem List There are no problems to display for this patient.   Madelaine Etienne, DPT, PN1   Supplemental Physical Therapist [facility]    Pager [phone] Acute Rehab Office [phone]   [facility] [place] [address] [place], [place], [address] Phone: [phone]   Fax:  [phone]  Name: JESYKA SLAGHT MRN: [id number] Date of Birth: [birth year]

## 2020-02-29 ENCOUNTER — Encounter: Payer: Self-pay | Admitting: Physical Therapy

## 2020-02-29 ENCOUNTER — Other Ambulatory Visit: Payer: Self-pay

## 2020-02-29 ENCOUNTER — Ambulatory Visit: Payer: Medicare HMO | Admitting: Physical Therapy

## 2020-02-29 DIAGNOSIS — R2681 Unsteadiness on feet: Secondary | ICD-10-CM

## 2020-02-29 DIAGNOSIS — G8929 Other chronic pain: Secondary | ICD-10-CM

## 2020-02-29 DIAGNOSIS — M6281 Muscle weakness (generalized): Secondary | ICD-10-CM | POA: Diagnosis not present

## 2020-02-29 DIAGNOSIS — M25562 Pain in left knee: Secondary | ICD-10-CM | POA: Diagnosis not present

## 2020-02-29 DIAGNOSIS — M25561 Pain in right knee: Secondary | ICD-10-CM | POA: Diagnosis not present

## 2020-02-29 NOTE — Therapy (Signed)
[facility] [place] [address] [place], [place], [address] Phone: [phone]   Fax:  [phone]  Physical Therapy Treatment  Patient Details  Name: Angelica Owens MRN: [id number] Date of Birth: 1940/11/03 Referring Provider (PT): Pricilla Holm    Encounter Date: 02/29/2020   PT End of Session - 02/29/20 1437    Visit Number 4    Number of Visits 12    Authorization Type [employer]    Authorization Time Period 02/17/2020 to 03/30/2020    Authorization - Visit Number 4    Authorization - Number of Visits 10    Progress Note Due on Visit 10    PT Start Time 1437    PT Stop Time 1515    PT Time Calculation (min) 38 min    Activity Tolerance Patient tolerated treatment well    Behavior During Therapy [facility] for tasks assessed/performed           History reviewed. No pertinent past medical history.  History reviewed. No pertinent surgical history.  There were no vitals filed for this visit.   Subjective Assessment - 02/29/20 1441    Subjective "I feel like I'm getting stronger".  Pt reports no pain in knees. She reports she would like to work on balance with walking. She has been completing HEP daily.    Patient Stated Goals less pain/no falls    Currently in Pain? No/denies    Pain Score 0-No pain              [facility] PT Assessment - 02/29/20 0001      Assessment   Medical Diagnosis knee pain     Referring Provider (PT) Cordelia Pen Ryter-Brown     Onset Date/Surgical Date --   spring 2021    Next MD Visit referring MD in january     Prior Therapy none             [facility] Adult PT Treatment/Exercise - 02/29/20 0001      Knee/Hip Exercises: Aerobic   Nustep L4-5: legs only 5 min      Knee/Hip Exercises: Standing   SLS Rt/Lt SLS x 15 sec with intermittent UE support.    Other Standing Knee Exercises quarter squats at counter with pause in squat x 10 (cues for form)      Knee/Hip Exercises: Seated   Sit to  Sand 10 reps;without UE support   eccentric lowering     Knee/Hip Exercises: Supine   Bridges 1 set;10 reps   5 sec hold in ext.   Straight Leg Raises Right;Left;1 set;5 reps           Balance Exercises - 02/29/20 0001      Balance Exercises: Standing   Tandem Stance Eyes open;1 rep;Intermittent upper extremity support;15 secs    Wall Bumps Eyes opened;10 reps;Hip    Partial Tandem Stance Eyes open;Intermittent upper extremity support;1 rep   vertical and horiz head turns, next to counter   Other Standing Exercises semi-tandem stance with eyes closed in corner x 15 sec each leg forward; backwards walking with intermittent UE support on counter x 20 ft.               PT Short Term Goals - 02/17/20 1639      PT SHORT TERM GOAL #1   Title Patient will be independent with appropriate HEP    Time 3    Period Weeks    Status New    Target  Date 03/09/20      PT SHORT TERM GOAL #2   Title Patient will show improved flexibility in BLE musculature by 50% in order to reduce pain and allow for more advanced strengthening    Time 3    Period Weeks      PT SHORT TERM GOAL #3   Title Patient will report knee pain as being no more than 4/10 at worst    Time 3    Period Weeks    Status New      PT SHORT TERM GOAL #4   Title Patient will be compliant with regular walking routine at least 10-20 minutes daily    Time 3    Period Weeks    Status New             PT Long Term Goals - 02/29/20 1442      PT LONG TERM GOAL #1   Title Patient will show MMT at least 4+/5 though an increased range of motion in order to show functional strength gain and support knees through greater ROM    Time 6    Period Weeks    Status On-going      PT LONG TERM GOAL #2   Title Patient to score at least 19/24 on DGI to show reduced fall risk    Time 6    Period Weeks    Status On-going      PT LONG TERM GOAL #3   Title Patient will be able to perform squat with correct form without cuing in  order to show good mechanics and protect knees from excessive wear and tear    Time 6    Period Weeks    Status On-going      PT LONG TERM GOAL #4   Title Patient will be compliant with regular exercise program which can be directly translated to local gym in order to facilitate ongoing fitness routine after DC    Time 6    Period Weeks    Status On-going                 Plan - 02/29/20 1519    Clinical Impression Statement Pt tolerated all exercises well, without any production of pain.  She demonstrates decreased balance with narrow base of support and Rt SLS exercises.  Making good gains towards goals.    Personal Factors and Comorbidities Age;Fitness;Time since onset of injury/illness/exacerbation;Comorbidity 1    Comorbidities fibromyalgia    Examination-Activity Limitations Locomotion Level;Transfers;Bend;Squat;Stairs;Stand;Lift    Stability/Clinical Decision Making Stable/Uncomplicated    Rehab Potential Excellent    PT Frequency 2x / week    PT Duration 6 weeks    PT Treatment/Interventions ADLs/Self Care Home Management;Aquatic Therapy;Cryotherapy;Electrical Stimulation;Iontophoresis 4mg /ml Dexamethasone;Moist Heat;Ultrasound;DME Instruction;Gait training;Stair training;Functional mobility training;Therapeutic activities;Therapeutic exercise;Balance training;Neuromuscular re-education;Patient/family education;Orthotic Fit/Training;Manual techniques;Passive range of motion;Dry needling;Energy conservation;Taping;Joint Manipulations    PT Next Visit Plan assess LE strength.  Add quad/hamstring stretches to HEP.  increase cardiovascular endurance and balance. Continue closed chain strength.    PT Home Exercise Plan [email or web address]    Consulted and Agree with Plan of Care Patient           Patient will benefit from skilled therapeutic intervention in order to improve the following deficits and impairments:  Difficulty walking,Increased fascial restricitons,Decreased  endurance,Increased muscle spasms,Decreased activity tolerance,Pain,Decreased balance,Improper body mechanics,Decreased mobility,Decreased strength,Postural dysfunction  Visit Diagnosis: Chronic pain of right knee  Chronic pain of left knee  Muscle weakness (generalized)  Unsteadiness on  feet     Problem List There are no problems to display for this patient.   Mayer Camel, PTA 02/29/20 3:36 PM  [facility] [place] [address] [place], [place], [address] Phone: [phone]   Fax:  [phone]  Name: Angelica Owens MRN: [id number] Date of Birth: [birth year]

## 2020-03-02 ENCOUNTER — Encounter: Payer: Medicare HMO | Admitting: Physical Therapy

## 2020-03-02 ENCOUNTER — Encounter: Payer: Self-pay | Admitting: Physical Therapy

## 2020-03-02 ENCOUNTER — Telehealth: Payer: Self-pay | Admitting: Physical Therapy

## 2020-03-02 NOTE — Telephone Encounter (Signed)
No show for today's appointment. Attempted to call but there was no answer and voicemail was not available.   Madelaine Etienne, DPT, PN1   Supplemental Physical Therapist [facility]    Pager [phone] Acute Rehab Office [phone]

## 2020-03-08 ENCOUNTER — Other Ambulatory Visit: Payer: Self-pay

## 2020-03-08 ENCOUNTER — Encounter: Payer: Self-pay | Admitting: Physical Therapy

## 2020-03-08 ENCOUNTER — Ambulatory Visit (INDEPENDENT_AMBULATORY_CARE_PROVIDER_SITE_OTHER): Payer: Medicare HMO | Admitting: Physical Therapy

## 2020-03-08 DIAGNOSIS — M25561 Pain in right knee: Secondary | ICD-10-CM | POA: Diagnosis not present

## 2020-03-08 DIAGNOSIS — M6281 Muscle weakness (generalized): Secondary | ICD-10-CM | POA: Diagnosis not present

## 2020-03-08 DIAGNOSIS — M25562 Pain in left knee: Secondary | ICD-10-CM

## 2020-03-08 DIAGNOSIS — G8929 Other chronic pain: Secondary | ICD-10-CM

## 2020-03-08 DIAGNOSIS — R2681 Unsteadiness on feet: Secondary | ICD-10-CM | POA: Diagnosis not present

## 2020-03-08 NOTE — Patient Instructions (Signed)
Access Code: [email or web address] URL: [email or web address] Date: 03/08/2020 Prepared by: Reggy Eye  Exercises Supine Bridge - 1 x daily - 7 x weekly - 1 sets - 10 reps - 5-10 hold Hooklying Clamshell with Resistance - 1 x daily - 7 x weekly - 3 sets - 10 reps Supine Active Straight Leg Raise - 1 x daily - 7 x weekly - 1 sets - 10 reps Supine Hamstring Stretch with Strap - 1 x daily - 7 x weekly - 3 sets - 1 reps - 20-30 seconds hold Mini Squat with Counter Support - 1 x daily - 7 x weekly - 1 sets - 10 reps - 2 hold Half Tandem Stance Balance with Head Rotation - 1 x daily - 7 x weekly - 1 sets - 2 reps - 15-30 sec hold Tandem Stance with Eyes Closed in Corner - 1 x daily - 7 x weekly - 1 sets - 2 reps - 15-30 hold Standing Single Leg Stance with Counter Support - 1 x daily - 7 x weekly - 1 sets - 2-3 reps - 15 seconds hold

## 2020-03-08 NOTE — Therapy (Signed)
[facility] [place] [address] [place], [place], [address] Phone: [phone]   Fax:  [phone]  Physical Therapy Treatment  Patient Details  Name: Angelica Owens MRN: [id number] Date of Birth: [birth year] Referring Provider (PT): Pricilla Holm    Encounter Date: 03/08/2020   PT End of Session - 03/08/20 1054    Visit Number 5    Number of Visits 12    Date for PT Re-Evaluation 03/30/20    Authorization Type [employer]    Authorization Time Period 02/17/2020 to 03/30/2020    Authorization - Visit Number 5    Authorization - Number of Visits 10    Progress Note Due on Visit 10    PT Start Time 1015    PT Stop Time 1058    PT Time Calculation (min) 43 min    Activity Tolerance Patient tolerated treatment well    Behavior During Therapy [facility] for tasks assessed/performed           History reviewed. No pertinent past medical history.  History reviewed. No pertinent surgical history.  There were no vitals filed for this visit.   Subjective Assessment - 03/08/20 1015    Subjective I feel ok. my bones don't like the cold    Patient Stated Goals less pain/no falls    Currently in Pain? No/denies    Pain Score 0-No pain    Pain Onset More than a month ago                             [facility] Adult PT Treatment/Exercise - 03/08/20 0001      High Level Balance   High Level Balance Comments standing on foam UE raises x 10 and mini squats without UE support on foam x 10      Knee/Hip Exercises: Aerobic   Nustep L5 x 5 mins legs only      Knee/Hip Exercises: Standing   SLS Rt/LT SLS with intermittent support. goal 10-15 sec    Other Standing Knee Exercises mini squats at counter x 10    Other Standing Knee Exercises tandem stance 2 x 30sec bilat with intermittent support      Knee/Hip Exercises: Supine   Bridges Strengthening;Both;1 set;10 reps   5 sec hold   Bridges with Clamshell  Strengthening;Both;2 sets;10 reps   clamshell only with red TB   Straight Leg Raises Strengthening;Both;10 reps                    PT Short Term Goals - 02/17/20 1639      PT SHORT TERM GOAL #1   Title Patient will be independent with appropriate HEP    Time 3    Period Weeks    Status New    Target Date 03/09/20      PT SHORT TERM GOAL #2   Title Patient will show improved flexibility in BLE musculature by 50% in order to reduce pain and allow for more advanced strengthening    Time 3    Period Weeks      PT SHORT TERM GOAL #3   Title Patient will report knee pain as being no more than 4/10 at worst    Time 3    Period Weeks    Status New      PT SHORT TERM GOAL #4   Title Patient will be compliant with regular walking routine at least 10-20 minutes  daily    Time 3    Period Weeks    Status New             PT Long Term Goals - 02/29/20 1442      PT LONG TERM GOAL #1   Title Patient will show MMT at least 4+/5 though an increased range of motion in order to show functional strength gain and support knees through greater ROM    Time 6    Period Weeks    Status On-going      PT LONG TERM GOAL #2   Title Patient to score at least 19/24 on DGI to show reduced fall risk    Time 6    Period Weeks    Status On-going      PT LONG TERM GOAL #3   Title Patient will be able to perform squat with correct form without cuing in order to show good mechanics and protect knees from excessive wear and tear    Time 6    Period Weeks    Status On-going      PT LONG TERM GOAL #4   Title Patient will be compliant with regular exercise program which can be directly translated to local gym in order to facilitate ongoing fitness routine after DC    Time 6    Period Weeks    Status On-going                 Plan - 03/08/20 1020    Clinical Impression Statement Pt did well with progression of balance exercises. Pt continues to require frequent rest breaks due to  decreased endurance    Personal Factors and Comorbidities Age;Fitness;Time since onset of injury/illness/exacerbation;Comorbidity 1    Comorbidities fibromyalgia    Examination-Activity Limitations Locomotion Level;Transfers;Bend;Squat;Stairs;Stand;Lift    Examination-Participation Restrictions Cleaning;Community Activity;Yard Work    PT Frequency 2x / week    PT Duration 6 weeks    PT Treatment/Interventions ADLs/Self Care Home Management;Aquatic Therapy;Cryotherapy;Electrical Stimulation;Iontophoresis 4mg /ml Dexamethasone;Moist Heat;Ultrasound;DME Instruction;Gait training;Stair training;Functional mobility training;Therapeutic activities;Therapeutic exercise;Balance training;Neuromuscular re-education;Patient/family education;Orthotic Fit/Training;Manual techniques;Passive range of motion;Dry needling;Energy conservation;Taping;Joint Manipulations    PT Next Visit Plan continue to progress balance. try dynamic gait    PT Home Exercise Plan [email or web address]    Consulted and Agree with Plan of Care Patient           Patient will benefit from skilled therapeutic intervention in order to improve the following deficits and impairments:  Difficulty walking,Increased fascial restricitons,Decreased endurance,Increased muscle spasms,Decreased activity tolerance,Pain,Decreased balance,Improper body mechanics,Decreased mobility,Decreased strength,Postural dysfunction  Visit Diagnosis: Chronic pain of right knee  Chronic pain of left knee  Muscle weakness (generalized)  Unsteadiness on feet     Problem List There are no problems to display for this patient.  , PT  [facility] 03/08/2020, 10:56 AM  [facility] [address] [place], [place], [place] Phone: [phone]   Fax:  [phone]  Name: TRINITY HAUN MRN: Marian Sorrow Date of Birth: [birth year]

## 2020-03-16 ENCOUNTER — Ambulatory Visit: Payer: Medicare HMO | Admitting: Physical Therapy

## 2020-03-16 ENCOUNTER — Other Ambulatory Visit: Payer: Self-pay

## 2020-03-16 ENCOUNTER — Encounter: Payer: Self-pay | Admitting: Physical Therapy

## 2020-03-16 DIAGNOSIS — R2681 Unsteadiness on feet: Secondary | ICD-10-CM

## 2020-03-16 DIAGNOSIS — M25562 Pain in left knee: Secondary | ICD-10-CM | POA: Diagnosis not present

## 2020-03-16 DIAGNOSIS — M25561 Pain in right knee: Secondary | ICD-10-CM

## 2020-03-16 DIAGNOSIS — G8929 Other chronic pain: Secondary | ICD-10-CM

## 2020-03-16 DIAGNOSIS — M6281 Muscle weakness (generalized): Secondary | ICD-10-CM | POA: Diagnosis not present

## 2020-03-16 NOTE — Therapy (Signed)
[facility] [place] [address] [place] [address] [place], [place], [address] Phone: [phone]   Fax:  [phone]  Physical Therapy Treatment  Patient Details  Name: Angelica Owens MRN: [id number] Date of Birth: 1940-04-24 Referring Provider (PT): Pricilla Holm    Encounter Date: 03/16/2020   PT End of Session - 03/16/20 0925    Visit Number 6    Number of Visits 12    Date for PT Re-Evaluation 03/30/20    Authorization Type [employer]    Authorization Time Period 02/17/2020 to 03/30/2020    Authorization - Visit Number 6    Authorization - Number of Visits 10    Progress Note Due on Visit 10    PT Start Time 0845    PT Stop Time 0925    PT Time Calculation (min) 40 min    Activity Tolerance Patient tolerated treatment well    Behavior During Therapy [facility] for tasks assessed/performed           History reviewed. No pertinent past medical history.  History reviewed. No pertinent surgical history.  There were no vitals filed for this visit.   Subjective Assessment - 03/16/20 0848    Subjective "My Rt hip is bothering me a bit but mostly I am ok"    Patient Stated Goals less pain/no falls    Currently in Pain? Yes    Pain Score 1     Pain Location Hip    Pain Orientation Right    Pain Descriptors / Indicators Sore    Pain Onset More than a month ago                             [facility] Adult PT Treatment/Exercise - 03/16/20 0001      High Level Balance   High Level Balance Activities Head turns   gait with head turns, pt requiring min A for looking up while performing gait   High Level Balance Comments standing on foam reaching for cones with feet shoulder width apart and then in tandem with min A for tandem stance on foam      Knee/Hip Exercises: Stretches   Passive Hamstring Stretch 3 reps;Right;30 seconds      Knee/Hip Exercises: Aerobic   Nustep L5 x 5 mins legs only for warm up      Knee/Hip  Exercises: Standing   SLS Rt/Lt SLS without UE support up to 10 seconds    Other Standing Knee Exercises mini squats x 10 at counter    Other Standing Knee Exercises tandem stance 2 x 30 sec bilat without UE support      Knee/Hip Exercises: Supine   Bridges Strengthening;2 sets;10 reps    Bridges with Clamshell Strengthening;Both;2 sets;10 reps   clamshell only with red TB   Straight Leg Raises Strengthening;Right;10 reps;2 sets   cues for quad set during SLR                   PT Short Term Goals - 02/17/20 1639      PT SHORT TERM GOAL #1   Title Patient will be independent with appropriate HEP    Time 3    Period Weeks    Status New    Target Date 03/09/20      PT SHORT TERM GOAL #2   Title Patient will show improved flexibility in BLE musculature by 50% in order to reduce pain and allow for  more advanced strengthening    Time 3    Period Weeks      PT SHORT TERM GOAL #3   Title Patient will report knee pain as being no more than 4/10 at worst    Time 3    Period Weeks    Status New      PT SHORT TERM GOAL #4   Title Patient will be compliant with regular walking routine at least 10-20 minutes daily    Time 3    Period Weeks    Status New             PT Long Term Goals - 02/29/20 1442      PT LONG TERM GOAL #1   Title Patient will show MMT at least 4+/5 though an increased range of motion in order to show functional strength gain and support knees through greater ROM    Time 6    Period Weeks    Status On-going      PT LONG TERM GOAL #2   Title Patient to score at least 19/24 on DGI to show reduced fall risk    Time 6    Period Weeks    Status On-going      PT LONG TERM GOAL #3   Title Patient will be able to perform squat with correct form without cuing in order to show good mechanics and protect knees from excessive wear and tear    Time 6    Period Weeks    Status On-going      PT LONG TERM GOAL #4   Title Patient will be compliant with  regular exercise program which can be directly translated to local gym in order to facilitate ongoing fitness routine after DC    Time 6    Period Weeks    Status On-going                 Plan - 03/16/20 0925    Clinical Impression Statement Pt fatigues quickly with higher level balance activities but is improving static balance    Personal Factors and Comorbidities Age;Fitness;Time since onset of injury/illness/exacerbation;Comorbidity 1    Comorbidities fibromyalgia    Examination-Activity Limitations Locomotion Level;Transfers;Bend;Squat;Stairs;Stand;Lift    Examination-Participation Restrictions Cleaning;Community Activity;Yard Work    Stability/Clinical Decision Making Stable/Uncomplicated    PT Frequency 2x / week    PT Duration 6 weeks    PT Treatment/Interventions ADLs/Self Care Home Management;Aquatic Therapy;Cryotherapy;Electrical Stimulation;Iontophoresis 4mg /ml Dexamethasone;Moist Heat;Ultrasound;DME Instruction;Gait training;Stair training;Functional mobility training;Therapeutic activities;Therapeutic exercise;Balance training;Neuromuscular re-education;Patient/family education;Orthotic Fit/Training;Manual techniques;Passive range of motion;Dry needling;Energy conservation;Taping;Joint Manipulations    PT Next Visit Plan continue dynamic gait training, update HEP    PT Home Exercise Plan [id number]    Consulted and Agree with Plan of Care Patient           Patient will benefit from skilled therapeutic intervention in order to improve the following deficits and impairments:  Difficulty walking,Increased fascial restricitons,Decreased endurance,Increased muscle spasms,Decreased activity tolerance,Pain,Decreased balance,Improper body mechanics,Decreased mobility,Decreased strength,Postural dysfunction  Visit Diagnosis: Chronic pain of left knee  Chronic pain of right knee  Muscle weakness (generalized)  Unsteadiness on feet     Problem List There are no  problems to display for this patient. , PT   [facility] 03/16/2020, 9:28 AM  [facility] [address] [place] [address] [place], [place], [place] Phone: [phone]   Fax:  [phone]  Name: Angelica Owens MRN: Marian Sorrow Date of Birth: 09-11-1940

## 2020-03-21 ENCOUNTER — Encounter: Payer: Self-pay | Admitting: Physical Therapy

## 2020-03-21 ENCOUNTER — Ambulatory Visit (INDEPENDENT_AMBULATORY_CARE_PROVIDER_SITE_OTHER): Payer: Medicare HMO | Admitting: Physical Therapy

## 2020-03-21 ENCOUNTER — Other Ambulatory Visit: Payer: Self-pay

## 2020-03-21 DIAGNOSIS — R2681 Unsteadiness on feet: Secondary | ICD-10-CM

## 2020-03-21 DIAGNOSIS — M25561 Pain in right knee: Secondary | ICD-10-CM

## 2020-03-21 DIAGNOSIS — M25562 Pain in left knee: Secondary | ICD-10-CM

## 2020-03-21 DIAGNOSIS — M6281 Muscle weakness (generalized): Secondary | ICD-10-CM | POA: Diagnosis not present

## 2020-03-21 DIAGNOSIS — G8929 Other chronic pain: Secondary | ICD-10-CM

## 2020-03-21 NOTE — Therapy (Signed)
[facility] [place] [address] [place] [address] [place], [place], [address] Phone: [phone]   Fax:  [phone]  Physical Therapy Treatment  Patient Details  Name: Angelica Owens MRN: [id number] Date of Birth: 1940-12-29 Referring Provider (PT): Pricilla Holm    Encounter Date: 03/21/2020   PT End of Session - 03/21/20 0927    Visit Number 7    Number of Visits 12    Date for PT Re-Evaluation 03/30/20    Authorization Type [employer]    Authorization Time Period 02/17/2020 to 03/30/2020    Authorization - Visit Number 7    Authorization - Number of Visits 10    Progress Note Due on Visit 10    PT Start Time 0929    PT Stop Time 1012    PT Time Calculation (min) 43 min    Activity Tolerance Patient tolerated treatment well    Behavior During Therapy [facility] for tasks assessed/performed           History reviewed. No pertinent past medical history.  History reviewed. No pertinent surgical history.  There were no vitals filed for this visit.   Subjective Assessment - 03/21/20 0929    Subjective Pt reports she was worked hard last visit and she was glad.  She has done her HEP and it seems to be helping    Patient Stated Goals less pain/no falls    Currently in Pain? No/denies              [facility] PT Assessment - 03/21/20 0001      Assessment   Medical Diagnosis knee pain     Referring Provider (PT) Pricilla Holm                          [facility] Adult PT Treatment/Exercise - 03/21/20 0001      Knee/Hip Exercises: Stretches   Hip Flexor Stretch Both;30 seconds    Hip Flexor Stretch Limitations seated on edge of mat    Gastroc Stretch Both;30 seconds    Other Knee/Hip Stretches hooklying knees IR/ER stretching flow      Knee/Hip Exercises: Aerobic   Nustep L5 x 5 mins legs only for warm up      Knee/Hip Exercises: Standing   Hip ADduction Strengthening;Both;10 reps    Abduction Limitations stepping  up on to 8" step    Forward Step Up Both;10 reps;Step Height: 8"    Forward Step Up Limitations without touching other leg down    SLS with Vectors reps each side with toe taps FWD/side/BWD                    PT Short Term Goals - 03/21/20 1008      PT SHORT TERM GOAL #1   Title Patient will be independent with appropriate HEP    Status On-going      PT SHORT TERM GOAL #2   Title Patient will show improved flexibility in BLE musculature by 50% in order to reduce pain and allow for more advanced strengthening    Status On-going      PT SHORT TERM GOAL #3   Title Patient will report knee pain as being no more than 4/10 at worst    Baseline minimla knee pain lately    Status Achieved      PT SHORT TERM GOAL #4   Title Patient will be compliant with regular walking routine at least 10-20 minutes  daily    Status On-going             PT Long Term Goals - 02/29/20 1442      PT LONG TERM GOAL #1   Title Patient will show MMT at least 4+/5 though an increased range of motion in order to show functional strength gain and support knees through greater ROM    Time 6    Period Weeks    Status On-going      PT LONG TERM GOAL #2   Title Patient to score at least 19/24 on DGI to show reduced fall risk    Time 6    Period Weeks    Status On-going      PT LONG TERM GOAL #3   Title Patient will be able to perform squat with correct form without cuing in order to show good mechanics and protect knees from excessive wear and tear    Time 6    Period Weeks    Status On-going      PT LONG TERM GOAL #4   Title Patient will be compliant with regular exercise program which can be directly translated to local gym in order to facilitate ongoing fitness routine after DC    Time 6    Period Weeks    Status On-going                 Plan - 03/21/20 1010    Clinical Impression Statement Angelica Owens is making good progress to her goals.  Knee pain is not a lot of her issue  anymore.  She wishes to work on her balance along with stregthening. Would benifit from continued treatment to strengthen her core/hips and work on balance    Rehab Potential Excellent    PT Frequency 2x / week    PT Duration 6 weeks    PT Treatment/Interventions ADLs/Self Care Home Management;Aquatic Therapy;Cryotherapy;Electrical Stimulation;Iontophoresis 4mg /ml Dexamethasone;Moist Heat;Ultrasound;DME Instruction;Gait training;Stair training;Functional mobility training;Therapeutic activities;Therapeutic exercise;Balance training;Neuromuscular re-education;Patient/family education;Orthotic Fit/Training;Manual techniques;Passive range of motion;Dry needling;Energy conservation;Taping;Joint Manipulations    PT Next Visit Plan continue dynamic gait training, update HEP    PT Home Exercise Plan [id number]    Consulted and Agree with Plan of Care Patient           Patient will benefit from skilled therapeutic intervention in order to improve the following deficits and impairments:  Difficulty walking,Increased fascial restricitons,Decreased endurance,Increased muscle spasms,Decreased activity tolerance,Pain,Decreased balance,Improper body mechanics,Decreased mobility,Decreased strength,Postural dysfunction  Visit Diagnosis: Chronic pain of left knee  Chronic pain of right knee  Muscle weakness (generalized)  Unsteadiness on feet     Problem List There are no problems to display for this patient.   PT  03/21/2020, 10:13 AM  [facility] [address] [place] [address] [place], [place], [place] Phone: [phone]   Fax:  [phone]  Name: Angelica Owens MRN: Marian Sorrow Date of Birth: 05/09/1940

## 2020-03-24 ENCOUNTER — Other Ambulatory Visit: Payer: Self-pay

## 2020-03-24 ENCOUNTER — Ambulatory Visit (INDEPENDENT_AMBULATORY_CARE_PROVIDER_SITE_OTHER): Payer: Medicare HMO | Admitting: Physical Therapy

## 2020-03-24 ENCOUNTER — Encounter: Payer: Self-pay | Admitting: Physical Therapy

## 2020-03-24 DIAGNOSIS — M6281 Muscle weakness (generalized): Secondary | ICD-10-CM

## 2020-03-24 DIAGNOSIS — G8929 Other chronic pain: Secondary | ICD-10-CM

## 2020-03-24 DIAGNOSIS — M25561 Pain in right knee: Secondary | ICD-10-CM | POA: Diagnosis not present

## 2020-03-24 DIAGNOSIS — R2681 Unsteadiness on feet: Secondary | ICD-10-CM

## 2020-03-24 DIAGNOSIS — M25562 Pain in left knee: Secondary | ICD-10-CM

## 2020-03-24 NOTE — Therapy (Signed)
[place], [place], [address] Phone: [phone]   Fax:  [phone]  Physical Therapy Treatment  Patient Details  Name: Angelica Owens MRN: [id number] Date of Birth: 1941/03/08 Referring Provider (PT): Elenor Quinones    Encounter Date: 03/24/2020   PT End of Session - 03/24/20 0928    Visit Number 8    Number of Visits 12    Date for PT Re-Evaluation 03/30/20    Authorization Type [employer]    Authorization Time Period 02/17/2020 to 03/30/2020    Authorization - Visit Number 8    Authorization - Number of Visits 10    Progress Note Due on Visit 10    PT Start Time 0928    PT Stop Time 1009    PT Time Calculation (min) 41 min    Activity Tolerance Patient tolerated treatment well    Behavior During Therapy [facility] for tasks assessed/performed           History reviewed. No pertinent past medical history.  History reviewed. No pertinent surgical history.  There were no vitals filed for this visit.   Subjective Assessment - 03/24/20 0928    Subjective Pt reports she is doing well, no pain    Patient Stated Goals less pain/no falls    Currently in Pain? No/denies              [facility] PT Assessment - 03/24/20 0001      Assessment   Medical Diagnosis knee pain     Referring Provider (PT) Judeen Hammans Ryter-Brown       Standardized Balance Assessment   Standardized Balance Assessment Dynamic Gait Index      Dynamic Gait Index   Level Surface Normal    Change in Gait Speed Mild Impairment    Gait with Horizontal Head Turns Mild Impairment    Gait with Vertical Head Turns Normal    Gait and Pivot Turn Normal    Step Over Obstacle Mild Impairment    Step Around Obstacles Normal    Steps Normal    Total Score 21                         [facility] Adult PT Treatment/Exercise - 03/24/20 0001      High Level Balance   High Level Balance Comments standing on BOSU 3x30 sec  holds with PRN UE assist, 2x10 FWD reach with green band - working on anit rotation of trunk      Knee/Hip Exercises: Stretches   Hip Flexor Stretch Both;3 reps;30 seconds   on half bolster with SKTC   Piriformis Stretch Both;1 rep;30 seconds   in figure 4 with UE overpressure   Other Knee/Hip Stretches hooklying knees IR/ER stretching flow x20reps then 1 reps with over pressure and 20 sec hold    Other Knee/Hip Stretches SKTC on half bolster with hip flexion sterching      Knee/Hip Exercises: Aerobic   Nustep L5 x 5 mins legs only for warm up      Knee/Hip Exercises: Standing   Other Standing Knee Exercises 2x10 mini squats on BOSU with UE holding on. VC for form      Knee/Hip Exercises: Supine   Single Leg Bridge Strengthening;Both;2 sets;10 reps   firgure 4     Knee/Hip Exercises: Sidelying   Other Sidelying Knee/Hip Exercises 10 reps each pilates FWD/BWD kicks, CW/CCW circles and FWD/BWD taps  PT Short Term Goals - 03/24/20 0932      PT SHORT TERM GOAL #1   Title Patient will be independent with appropriate HEP    Status Achieved      PT SHORT TERM GOAL #2   Title Patient will show improved flexibility in BLE musculature by 50% in order to reduce pain and allow for more advanced strengthening    Status Achieved      PT SHORT TERM GOAL #3   Title Patient will report knee pain as being no more than 4/10 at worst    Status Achieved      PT SHORT TERM GOAL #4   Title Patient will be compliant with regular walking routine at least 10-20 minutes daily    Baseline hasn't attemtped yet - will try this weekend    Status On-going             PT Long Term Goals - 03/24/20 0932      PT LONG TERM GOAL #1   Title Patient will show MMT at least 4+/5 though an increased range of motion in order to show functional strength gain and support knees through greater ROM    Status On-going      PT LONG TERM GOAL #2   Title Patient to score at least 19/24  on DGI to show reduced fall risk    Baseline 21/24    Status Achieved      PT LONG TERM GOAL #3   Title Patient will be able to perform squat with correct form without cuing in order to show good mechanics and protect knees from excessive wear and tear    Status On-going      PT LONG TERM GOAL #4   Title Patient will be compliant with regular exercise program which can be directly translated to local gym in order to facilitate ongoing fitness routine after DC    Status On-going                 Plan - 03/24/20 [place] continues to make good progress to her goals.  She has met her balance DGI goal and reports feeling alot safer and more steady with ambulation.  She has't had knee or hip pain this week.  Angelica Owens has not returned to her walking program and will start that this weekend to see how she feels.  If everything is good she will be ready for discharge to HEP next visit as she is pleased with her progress.    Rehab Potential Excellent    PT Frequency 2x / week    PT Duration 6 weeks    PT Treatment/Interventions ADLs/Self Care Home Management;Aquatic Therapy;Cryotherapy;Electrical Stimulation;Iontophoresis 27m/ml Dexamethasone;Moist Heat;Ultrasound;DME Instruction;Gait training;Stair training;Functional mobility training;Therapeutic activities;Therapeutic exercise;Balance training;Neuromuscular re-education;Patient/family education;Orthotic Fit/Training;Manual techniques;Passive range of motion;Dry needling;Energy conservation;Taping;Joint Manipulations    PT Next Visit Plan finalize HEP and discharge    PT [employer] Agree with Plan of Care Patient           Patient will benefit from skilled therapeutic intervention in order to improve the following deficits and impairments:  Difficulty walking,Increased fascial restricitons,Decreased endurance,Increased muscle spasms,Decreased activity tolerance,Pain,Decreased  balance,Improper body mechanics,Decreased mobility,Decreased strength,Postural dysfunction  Visit Diagnosis: Chronic pain of left knee  Chronic pain of right knee  Muscle weakness (generalized)  Unsteadiness on feet     Problem List There are no problems to display for this patient.  Angelica Owens Angelica Owens PT  03/24/2020, 12:45 PM  [facility] [place], [place], [address] Phone: [phone]   Fax:  [phone]  Name: Angelica Owens MRN: [id number] Date of Birth: 05/17/1940

## 2020-03-31 ENCOUNTER — Encounter: Payer: Self-pay | Admitting: Physical Therapy

## 2020-03-31 ENCOUNTER — Ambulatory Visit: Payer: Medicare HMO | Admitting: Physical Therapy

## 2020-03-31 ENCOUNTER — Other Ambulatory Visit: Payer: Self-pay

## 2020-03-31 DIAGNOSIS — M25561 Pain in right knee: Secondary | ICD-10-CM

## 2020-03-31 DIAGNOSIS — M6281 Muscle weakness (generalized): Secondary | ICD-10-CM | POA: Diagnosis not present

## 2020-03-31 DIAGNOSIS — M25562 Pain in left knee: Secondary | ICD-10-CM

## 2020-03-31 DIAGNOSIS — R2681 Unsteadiness on feet: Secondary | ICD-10-CM

## 2020-03-31 DIAGNOSIS — G8929 Other chronic pain: Secondary | ICD-10-CM

## 2020-03-31 NOTE — Therapy (Signed)
[place], [place], [address] Phone: [phone]   Fax:  [phone]  Physical Therapy Treatment/Discharge  Patient Details  Name: Angelica Owens MRN: [id number] Date of Birth: [birth year] Referring Provider (PT): Elenor Quinones    Encounter Date: 03/31/2020   PT End of Session - 03/31/20 1018    Visit Number 9    Date for PT Re-Evaluation 03/30/20    Authorization Type [employer]    Authorization Time Period 02/17/2020 to 03/30/2020    Authorization - Visit Number 9    Authorization - Number of Visits 10    PT Start Time 8341    PT Stop Time 1056    PT Time Calculation (min) 38 min    Activity Tolerance Patient tolerated treatment well    Behavior During Therapy [facility] for tasks assessed/performed           History reviewed. No pertinent past medical history.  History reviewed. No pertinent surgical history.  There were no vitals filed for this visit.   Subjective Assessment - 03/31/20 1020    Subjective Angelica Owens reports she is exhuasted today as she had to do a lot of walking yesterday    Patient Stated Goals less pain/no falls    Currently in Pain? No/denies              [facility] PT Assessment - 03/31/20 0001      Assessment   Medical Diagnosis knee pain     Referring Provider (PT) Judeen Hammans Ryter-Brown       Strength   Right Hip Flexion 5/5    Right Hip Extension 5/5    Right Hip ABduction 5/5    Left Hip Flexion 5/5    Left Hip Extension 4+/5    Left Hip ADduction --   5-/5                        [facility] Adult PT Treatment/Exercise - 03/31/20 0001      Knee/Hip Exercises: Stretches   Passive Hamstring Stretch Both;3 reps;30 seconds   supine with strap   ITB Stretch Both;3 reps;30 seconds   cross body with strap   Other Knee/Hip Stretches figure 4 stretch each hip, 10 x3 sec leg lengtheners    Other Knee/Hip Stretches 15 reps knees in/out in hooklying then a 20  sec overpressure hold      Knee/Hip Exercises: Aerobic   Nustep L6 x 5 mins legs only for warm up      Knee/Hip Exercises: Seated   Sit to Sand 2 sets;10 reps;with UE support   VC for form     Knee/Hip Exercises: Sidelying   Other Sidelying Knee/Hip Exercises 10 reps each pilates FWD/BWD kicks, CW/CCW circles and FWD/BWD taps                    PT Short Term Goals - 03/31/20 1022      PT SHORT TERM GOAL #1   Title Patient will be independent with appropriate HEP    Status Achieved      PT SHORT TERM GOAL #2   Title Patient will show improved flexibility in BLE musculature by 50% in order to reduce pain and allow for more advanced strengthening    Status Achieved      PT SHORT TERM GOAL #3   Title Patient will report knee pain as being no more than 4/10 at worst    Status  Achieved             PT Long Term Goals - 03/31/20 1022      PT LONG TERM GOAL #1   Title Patient will show MMT at least 4+/5 though an increased range of motion in order to show functional strength gain and support knees through greater ROM    Status Achieved      PT LONG TERM GOAL #2   Title Patient to score at least 19/24 on DGI to show reduced fall risk    Baseline 21/24    Status Achieved      PT LONG TERM GOAL #3   Title Patient will be able to perform squat with correct form without cuing in order to show good mechanics and protect knees from excessive wear and tear    Baseline does require some cueing to maintain form    Status Partially Met      PT LONG TERM GOAL #4   Title Patient will be compliant with regular exercise program which can be directly translated to local gym in order to facilitate ongoing fitness routine after DC    Baseline returning to [place] - 03/31/20 [name] is very pleased with her progress.  She demonstrates a huge improvement in her lower body strength and her  balance.  She has a good HEP and is going to transition back to [employer] and walking.  Almost all her goals have been met and she is ready for discharge.    PT Next Visit Plan D/C to HEP and the gym    PT [place] and Agree with Plan of Care Patient           Patient will benefit from skilled therapeutic intervention in order to improve the following deficits and impairments:     Visit Diagnosis: Chronic pain of left knee  Chronic pain of right knee  Muscle weakness (generalized)  Unsteadiness on feet     Problem List There are no problems to display for this patient.   Manuela Schwartz Darrly Loberg PT  03/31/2020, 10:55 AM  [facility] [place], [place], [address] Phone: [phone]   Fax:  [phone]  Name: Angelica Owens MRN: [id number] Date of Birth: [birth year]  PHYSICAL THERAPY DISCHARGE SUMMARY  Visits from Start of Care: 9  Current functional level related to goals / functional outcomes: See above for current measurements and goal updates  Remaining deficits: none   Education / Equipment: HEP Plan: Patient agrees to discharge.  Patient goals were partially met. Patient is being discharged due to being pleased with the current functional level.  ?????    Jeral Pinch, PT 03/31/20 10:56 AM
